# Patient Record
Sex: Male | Born: 1975 | Race: White | Hispanic: No | Marital: Single | Smoking: Current every day smoker
Health system: Southern US, Community
[De-identification: ages and names within clinical notes are randomized; demographics above are authoritative.]

## PROBLEM LIST (undated history)

## (undated) HISTORY — PX: OTHER SURGICAL HISTORY: SHX169

---

## 2007-03-24 ENCOUNTER — Emergency Department (HOSPITAL_COMMUNITY): Admission: EM | Admit: 2007-03-24 | Discharge: 2007-03-24 | Payer: Self-pay | Admitting: Emergency Medicine

## 2007-07-05 ENCOUNTER — Emergency Department (HOSPITAL_COMMUNITY): Admission: EM | Admit: 2007-07-05 | Discharge: 2007-07-05 | Payer: Self-pay | Admitting: Emergency Medicine

## 2009-06-09 ENCOUNTER — Emergency Department (HOSPITAL_COMMUNITY): Admission: EM | Admit: 2009-06-09 | Discharge: 2009-06-09 | Payer: Self-pay | Admitting: Emergency Medicine

## 2009-11-17 ENCOUNTER — Emergency Department (HOSPITAL_COMMUNITY): Admission: EM | Admit: 2009-11-17 | Discharge: 2009-11-17 | Payer: Self-pay | Admitting: Emergency Medicine

## 2010-08-31 LAB — CULTURE, ROUTINE-ABSCESS

## 2011-08-09 IMAGING — CR DG LUMBAR SPINE COMPLETE 4+V
5 series · 5 of 5 positions shown · non-contrast
Comparison: None.

CLINICAL DATA: Back pain.  Leg pain.  Evaluate for dislocation.

LUMBAR SPINE - COMPLETE 4+ VIEW

[t l-spine a.p.]
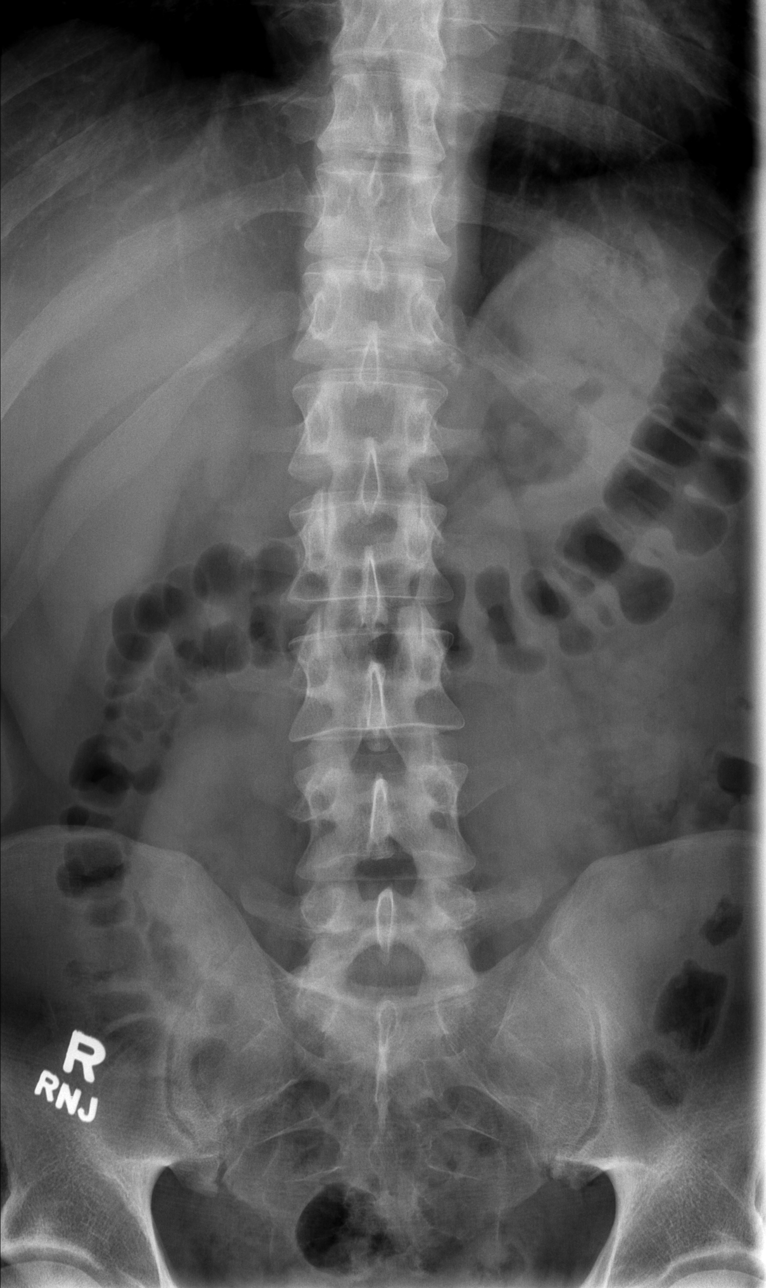

[t l-spine oblique exposure (1 of 2)]
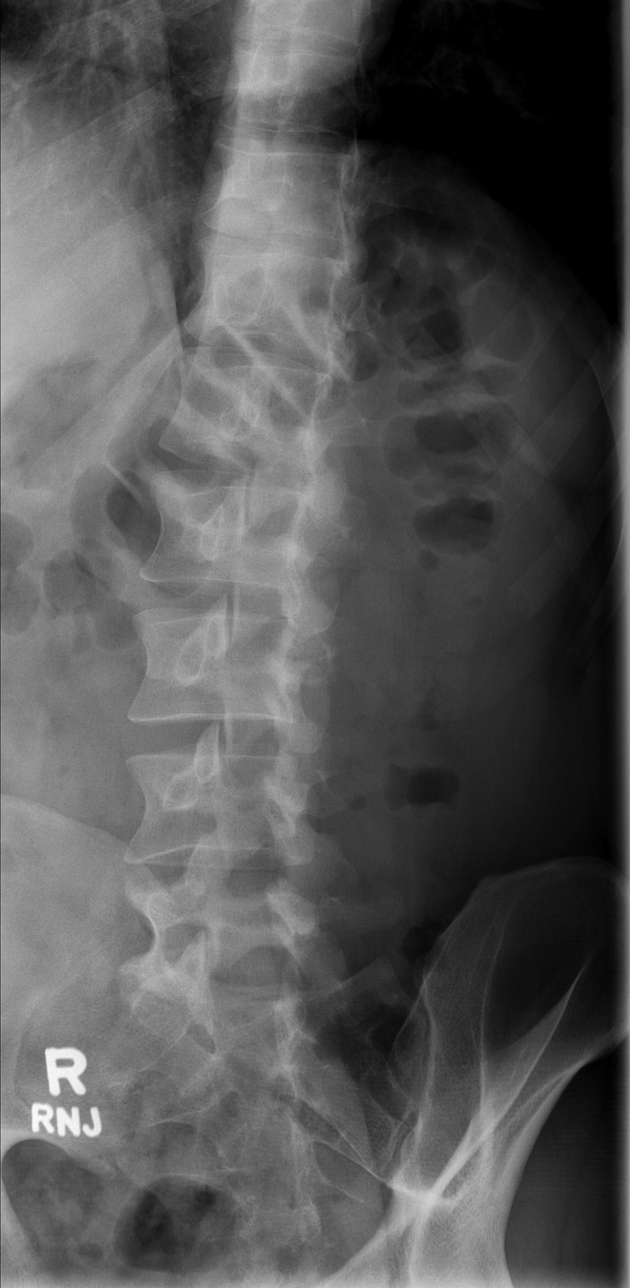

[t l-spine oblique exposure (2 of 2)]
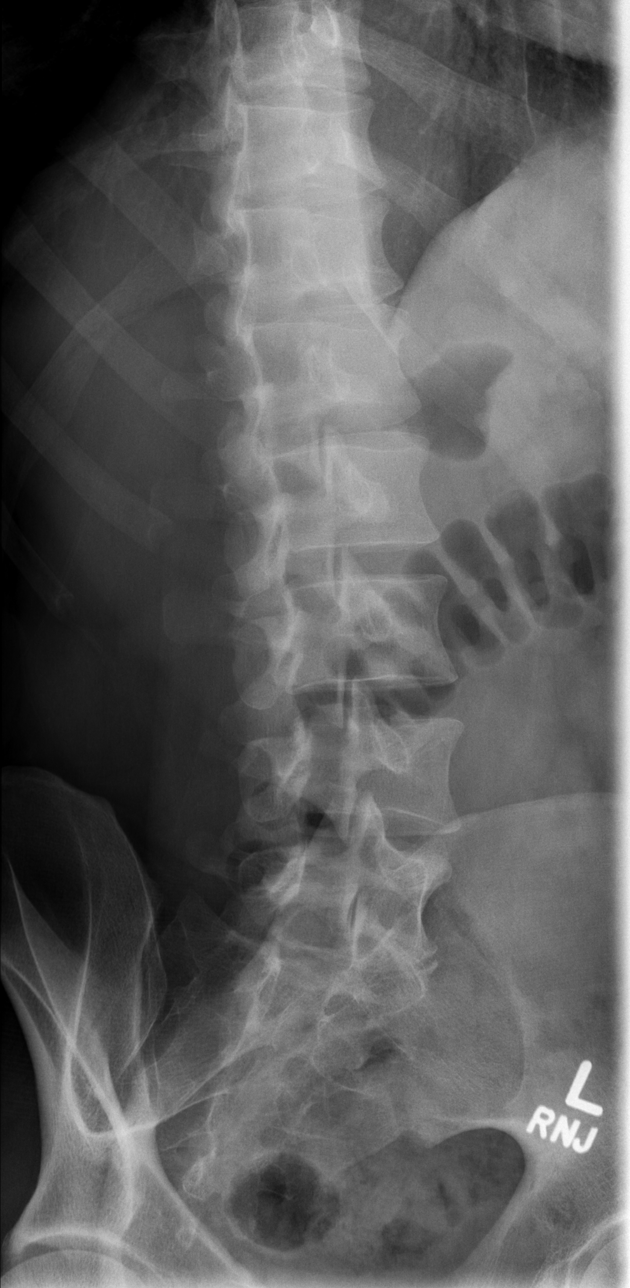

[t l-spine lat]
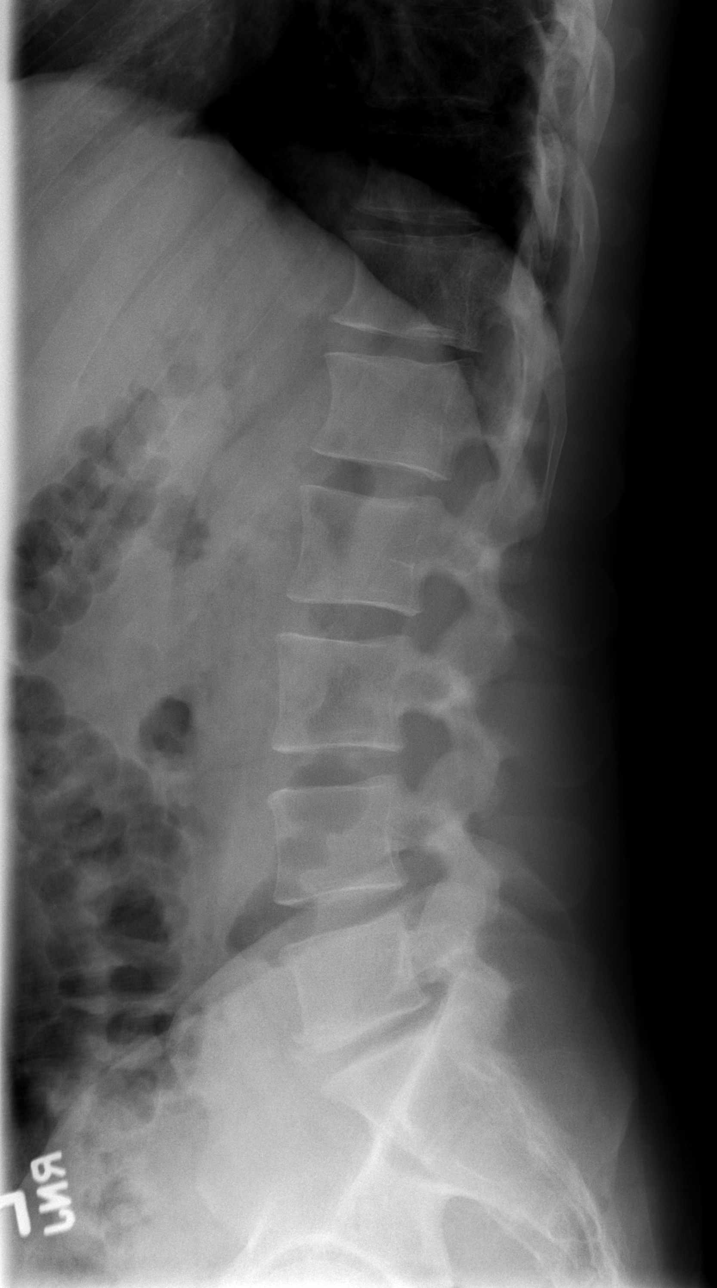

[t l-spine l5-s1 spot]
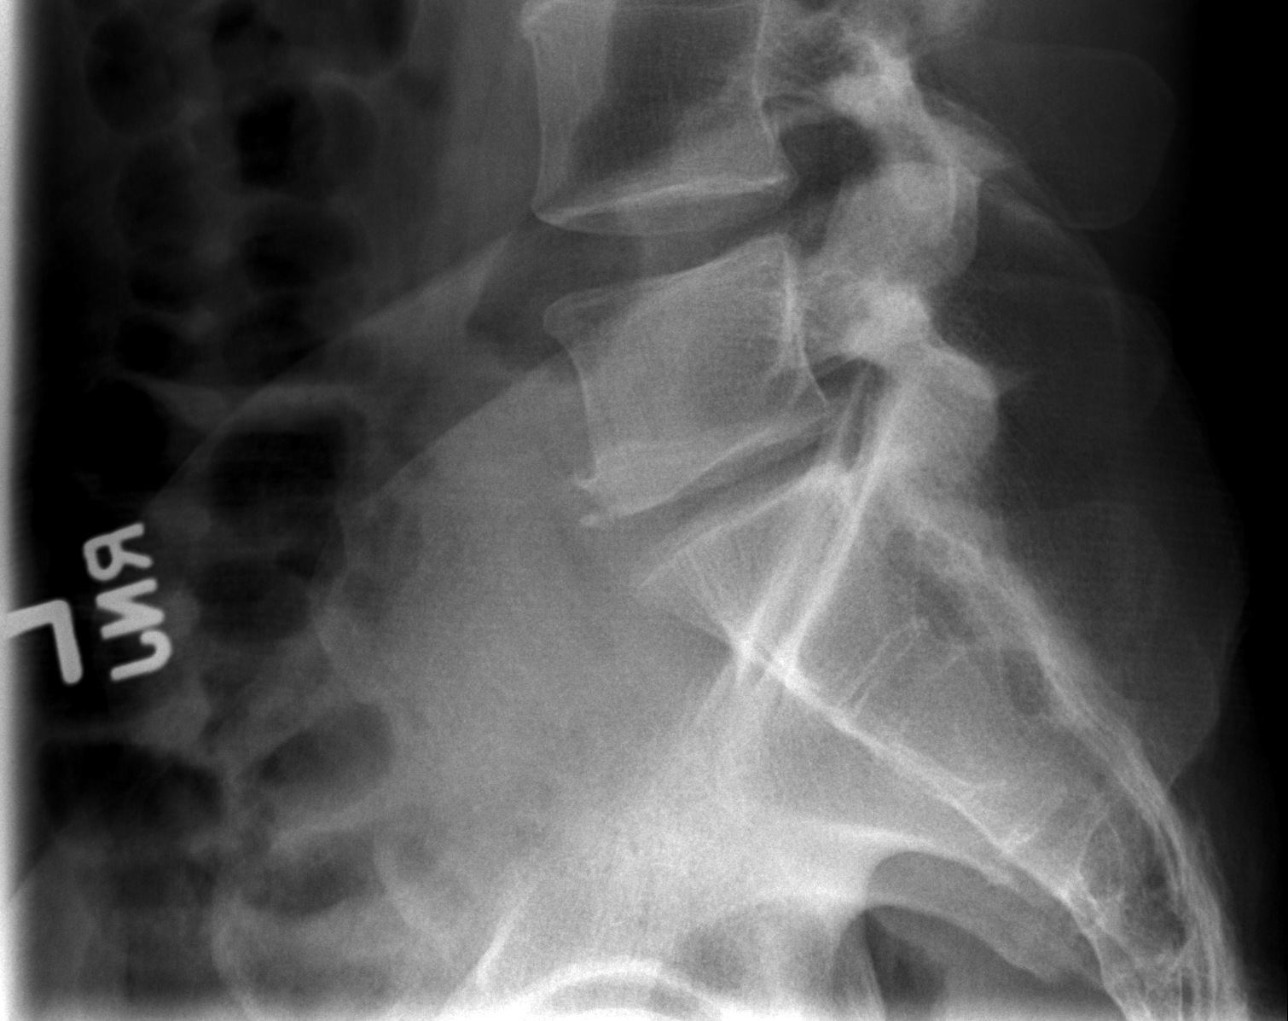

[5 of 5 positions shown; findings below may reference images not displayed]

FINDINGS: There is a mild dextroconvex scoliosis centered around L2-
L3.  Vertebral body height is preserved.  No pars interarticularis
defects are identified.  L5-S1 degenerative disc disease is present
with loss of disc height and endplate osteophytes. Please note that
under current guidelines, plain films have limited utility in the
assessment of low back pain unless "red flags" are present.
IMPRESSION: 1. L5-S1 spondylosis with loss of disc height.
2.  No acute osseous abnormality.

## 2014-01-13 ENCOUNTER — Encounter (HOSPITAL_COMMUNITY): Payer: Self-pay | Admitting: Emergency Medicine

## 2014-01-13 ENCOUNTER — Emergency Department (HOSPITAL_COMMUNITY)
Admission: EM | Admit: 2014-01-13 | Discharge: 2014-01-13 | Disposition: A | Discharge status: Home / Self Care | Payer: Self-pay | Attending: Emergency Medicine | Admitting: Emergency Medicine

## 2014-01-13 DIAGNOSIS — F17200 Nicotine dependence, unspecified, uncomplicated: Secondary | ICD-10-CM | POA: Insufficient documentation

## 2014-01-13 DIAGNOSIS — X19XXXA Contact with other heat and hot substances, initial encounter: Secondary | ICD-10-CM | POA: Insufficient documentation

## 2014-01-13 DIAGNOSIS — S0510XA Contusion of eyeball and orbital tissues, unspecified eye, initial encounter: Secondary | ICD-10-CM | POA: Insufficient documentation

## 2014-01-13 DIAGNOSIS — Y93G9 Activity, other involving cooking and grilling: Secondary | ICD-10-CM | POA: Insufficient documentation

## 2014-01-13 DIAGNOSIS — H109 Unspecified conjunctivitis: Secondary | ICD-10-CM | POA: Insufficient documentation

## 2014-01-13 DIAGNOSIS — Y9289 Other specified places as the place of occurrence of the external cause: Secondary | ICD-10-CM | POA: Insufficient documentation

## 2014-01-13 MED ORDER — TOBRAMYCIN 0.3 % OP OINT
TOPICAL_OINTMENT | Freq: Four times a day (QID) | OPHTHALMIC | Status: DC
  Filled 2014-01-13: qty 3.5

## 2014-01-13 MED ORDER — TOBRAMYCIN 0.3 % OP SOLN
2.0000 [drp] | Freq: Four times a day (QID) | OPHTHALMIC | Status: DC
  Administered 2014-01-13: 2 [drp] via OPHTHALMIC
  Filled 2014-01-13: qty 5

## 2014-01-13 MED ORDER — TETRACAINE HCL 0.5 % OP SOLN
2.0000 [drp] | Freq: Once | OPHTHALMIC | Status: DC
  Filled 2014-01-13: qty 2

## 2014-01-13 MED ORDER — HYDROCODONE-ACETAMINOPHEN 5-325 MG PO TABS: 1.0000 | ORAL_TABLET | ORAL | Status: DC | PRN

## 2014-01-13 MED ORDER — FLUORESCEIN SODIUM 1 MG OP STRP
1.0000 | ORAL_STRIP | Freq: Once | OPHTHALMIC | Status: DC
  Filled 2014-01-13: qty 1

## 2014-01-13 NOTE — ED Notes (Signed)
Pt c/o eye swelling, irritation, drainage, and redness since Tuesday.  Reported he was cooking a work on Tuesday and an onion hot onion popped up into eye.  Symptoms have progressively worsened.

## 2014-01-13 NOTE — ED Provider Notes (Signed)
CSN: [id number]     Arrival date & time 01/13/14  1353 History  This chart was scribed for non-physician practitioner, Elpidio AnisShari Teisha Trowbridge, PA-C working with Suzi RootsKevin E Steinl, MD by Greggory StallionKayla Andersen, ED scribe. This patient was seen in room WTR6/WTR6 and the patient's care was started at 3:09 PM.   Chief Complaint  Patient presents with  . Eye Injury   The history is provided by the patient. No language interpreter was used.   HPI Comments: Wayne GamblesStephen Blatchley is a 38 y.o. male who presents to the Emergency Department complaining of eye injury that occurred 5 days ago. States he was cooking and a hot onion popped up on his eyelid. He has worsening eye pain, swelling, irritation, redness, pus and watery drainage. Denies visual disturbances.   History reviewed. No pertinent past medical history. Past Surgical History  Procedure Laterality Date  . Skull surger     History reviewed. No pertinent family history. History  Substance Use Topics  . Smoking status: Current Every Day Smoker  . Smokeless tobacco: Not on file  . Alcohol Use: Yes    Review of Systems  HENT: Positive for facial swelling.   Eyes: Positive for pain, discharge and redness. Negative for visual disturbance.  All other systems reviewed and are negative.  Allergies  Review of patient's allergies indicates no known allergies.  Home Medications   Prior to Admission medications   Not on File   BP 139/84  Pulse 84  Temp(Src) 99 F (37.2 C) (Oral)  Resp 16  SpO2 100%  Physical Exam  Nursing note and vitals reviewed. Constitutional: He is oriented to person, place, and time. He appears well-developed and well-nourished. No distress.  HENT:  Head: Normocephalic and atraumatic.  Eyes: EOM are normal. Pupils are equal, round, and reactive to light.  Excessively swollen conjunctiva of left eye with purulent drainage and injection. Cornea appears clear. No hyphema. No obvious ulceration. Lids are unremarkable.   Neck: Neck supple.  No tracheal deviation present.  Cardiovascular: Normal rate.   Pulmonary/Chest: Effort normal. No respiratory distress.  Musculoskeletal: Normal range of motion.  Neurological: He is alert and oriented to person, place, and time.  Skin: Skin is warm and dry.  Psychiatric: He has a normal mood and affect. His behavior is normal.    ED Course  Procedures (including critical care time)  DIAGNOSTIC STUDIES: Oxygen Saturation is 100% on RA, normal by my interpretation.    COORDINATION OF CARE: 3:11 PM-Discussed treatment plan which includes checking for corneal ulcerations with pt at bedside and pt agreed to plan.   Labs Review Labs Reviewed - No data to display  Imaging Review No results found.   EKG Interpretation None      MDM   Final diagnoses:  None    1. Conjunctivitis  No visual impairment, no corneal ulceration. Suspect infection following cooking accident 5 days ago. Treated with Tobrex and ophtho follow up if symptoms are not improved within 48 hours.   I personally performed the services described in this documentation, which was scribed in my presence. The recorded information has been reviewed and is accurate.  Arnoldo HookerShari A Tierany Appleby, PA-C 01/13/14 1533

## 2014-01-13 NOTE — Discharge Instructions (Signed)

## 2014-01-14 NOTE — ED Provider Notes (Signed)
Medical screening examination/treatment/procedure(s) were performed by non-physician practitioner and as supervising physician I was immediately available for consultation/collaboration.     Erisa Mehlman E Jazmin Ley, MD 01/14/14 0910 

## 2017-05-09 DIAGNOSIS — L309 Dermatitis, unspecified: Secondary | ICD-10-CM | POA: Diagnosis not present

## 2018-12-28 DIAGNOSIS — Z20828 Contact with and (suspected) exposure to other viral communicable diseases: Secondary | ICD-10-CM | POA: Diagnosis not present

## 2019-06-19 ENCOUNTER — Ambulatory Visit: Payer: Self-pay | Attending: Internal Medicine

## 2019-06-19 DIAGNOSIS — Z20822 Contact with and (suspected) exposure to covid-19: Secondary | ICD-10-CM

## 2019-06-19 DIAGNOSIS — U071 COVID-19: Secondary | ICD-10-CM | POA: Insufficient documentation

## 2019-06-21 LAB — NOVEL CORONAVIRUS, NAA: SARS-CoV-2, NAA: DETECTED — AB

## 2022-02-28 ENCOUNTER — Emergency Department (HOSPITAL_COMMUNITY)
Admission: EM | Admit: 2022-02-28 | Discharge: 2022-02-28 | Disposition: A | Discharge status: Home / Self Care | Payer: BC Managed Care – PPO | Attending: Emergency Medicine | Admitting: Emergency Medicine

## 2022-02-28 ENCOUNTER — Encounter (HOSPITAL_COMMUNITY): Payer: Self-pay

## 2022-02-28 ENCOUNTER — Other Ambulatory Visit: Payer: Self-pay

## 2022-02-28 DIAGNOSIS — L02413 Cutaneous abscess of right upper limb: Secondary | ICD-10-CM | POA: Diagnosis not present

## 2022-02-28 DIAGNOSIS — L03113 Cellulitis of right upper limb: Secondary | ICD-10-CM

## 2022-02-28 DIAGNOSIS — L0291 Cutaneous abscess, unspecified: Secondary | ICD-10-CM

## 2022-02-28 MED ORDER — DOXYCYCLINE HYCLATE 100 MG PO CAPS: 100.0000 mg | ORAL_CAPSULE | Freq: Two times a day (BID) | ORAL | 0 refills | Status: DC

## 2022-02-28 MED ORDER — DOXYCYCLINE HYCLATE 100 MG PO TABS
100.0000 mg | ORAL_TABLET | Freq: Once | ORAL | Status: AC
  Administered 2022-02-28: 100 mg via ORAL
  Filled 2022-02-28: qty 1

## 2022-02-28 NOTE — ED Provider Notes (Signed)
[facility] DEPT Provider Note   CSN: [id number] Arrival date & time: 02/28/22  0998     History  Chief Complaint  Patient presents with   Insect Bite    Wayne Craig is a 46 y.o. male.  Patient presents to the hospital complaining of a possible insect bite to the right anterior forearm.  Patient states that he noticed after being outside on his porch for a long time Friday.  He states had a similar insect bite or lesion with drainage 2 weeks ago on his other forearm.  He states that the current lesion has been draining quite, purulent material, and was painful to sleep on last night.  He denies fevers, nausea, vomiting, abdominal pain, shortness of breath.  The patient has no other relevant past medical history  HPI     Home Medications Prior to Admission medications   Medication Sig Start Date End Date Taking? Authorizing Provider  doxycycline (VIBRAMYCIN) 100 MG capsule Take 1 capsule (100 mg total) by mouth 2 (two) times daily. 02/28/22  Yes Dorothyann Peng, PA-C  HYDROcodone-acetaminophen (NORCO/VICODIN) 5-325 MG per tablet Take 1-2 tablets by mouth every 4 (four) hours as needed. 01/13/14   Charlann Lange, PA-C      Allergies    Patient has no known allergies.    Review of Systems   Review of Systems  Skin:        Lesion to anterior right forearm    Physical Exam Updated Vital Signs BP (!) 175/98 (BP Location: Left Arm)   Pulse (!) 101   Temp 98.2 F (36.8 C) (Oral)   Resp 17   Ht 6' (1.829 m)   Wt 86.2 kg   SpO2 99%   BMI 25.77 kg/m  Physical Exam Vitals and nursing note reviewed.  Constitutional:      General: He is not in acute distress.    Appearance: He is well-developed.  HENT:     Head: Normocephalic and atraumatic.  Eyes:     Conjunctiva/sclera: Conjunctivae normal.  Cardiovascular:     Rate and Rhythm: Normal rate.  Pulmonary:     Effort: Pulmonary effort is normal.  Abdominal:     General: Abdomen is flat.   Musculoskeletal:        General: No swelling.     Cervical back: Normal range of motion.  Skin:    General: Skin is warm and dry.     Capillary Refill: Capillary refill takes less than 2 seconds.     Findings: Lesion present.     Comments: See image below.  Mild swelling noted and outlined.  Purulent discharge noted  Neurological:     Mental Status: He is alert.  Psychiatric:        Mood and Affect: Mood normal.      ED Results / Procedures / Treatments   Labs (all labs ordered are listed, but only abnormal results are displayed) Labs Reviewed - No data to display  EKG None  Radiology No results found.  Procedures Procedures    Medications Ordered in ED Medications  doxycycline (VIBRA-TABS) tablet 100 mg (has no administration in time range)    ED Course/ Medical Decision Making/ A&P                           Medical Decision Making  Patient presents to hospital with a chief complaint of skin lesion on the right forearm.  Differential includes but  is not limited to cellulitis, abscess, insect bite, and others  No significant fluctuance was noted on exam.  The wound is actively draining at this time.  I see no indication for incision and drainage.  There is no indication at this time for imaging.  The patient has normal vitals and I see no indication for lab work at this time.  The patient has a skin lesion consistent with possible abscess with cellulitis.  The wound is actively draining.  I do not feel fluctuant area amenable to incision and drainage at this time.  Plan to discharge patient home with course of doxycycline for skin, soft tissue coverage.  Patient given return precautions including systemic symptoms and spread of erythema outside of its current borders.  Patient voices understanding.        Final Clinical Impression(s) / ED Diagnoses Final diagnoses:  Abscess  Cellulitis of right upper extremity    Rx / DC Orders ED Discharge Orders           Ordered    doxycycline (VIBRAMYCIN) 100 MG capsule  2 times daily        02/28/22 0754              Darrick Grinder, PA-C 02/28/22 0759    Terrilee Files, MD 02/28/22 1815

## 2022-02-28 NOTE — ED Triage Notes (Addendum)
Ambulatory to the ED with c/o insect bite to R forearm. States he got bit by something on Friday afternoon. Large area of redness and swelling with approx. 1/2 inch area of open wound noted with active drainage. Denies fevers or chills.

## 2022-02-28 NOTE — Discharge Instructions (Addendum)
You were diagnosed today with cellulitis of the right forearm.  Please take the prescribed antibiotics.  If you develop a fever or if the area of redness begins to spread, please return for further evaluation.

## 2022-06-30 ENCOUNTER — Emergency Department (HOSPITAL_COMMUNITY)
Admission: EM | Admit: 2022-06-30 | Discharge: 2022-06-30 | Disposition: A | Discharge status: Home / Self Care | Payer: BC Managed Care – PPO | Attending: Emergency Medicine | Admitting: Emergency Medicine

## 2022-06-30 ENCOUNTER — Other Ambulatory Visit: Payer: Self-pay

## 2022-06-30 ENCOUNTER — Emergency Department (HOSPITAL_COMMUNITY): Payer: BC Managed Care – PPO

## 2022-06-30 DIAGNOSIS — R000 Tachycardia, unspecified: Secondary | ICD-10-CM | POA: Diagnosis not present

## 2022-06-30 DIAGNOSIS — R0602 Shortness of breath: Secondary | ICD-10-CM | POA: Diagnosis present

## 2022-06-30 DIAGNOSIS — J189 Pneumonia, unspecified organism: Secondary | ICD-10-CM

## 2022-06-30 DIAGNOSIS — J181 Lobar pneumonia, unspecified organism: Secondary | ICD-10-CM | POA: Diagnosis not present

## 2022-06-30 LAB — CBC WITH DIFFERENTIAL/PLATELET
Abs Immature Granulocytes: 1.56 10*3/uL — ABNORMAL HIGH (ref 0.00–0.07)
Basophils Absolute: 0.1 10*3/uL (ref 0.0–0.1)
Basophils Relative: 0 %
Eosinophils Absolute: 0 10*3/uL (ref 0.0–0.5)
Eosinophils Relative: 0 %
HCT: 44.4 % (ref 39.0–52.0)
Hemoglobin: 15.4 g/dL (ref 13.0–17.0)
Immature Granulocytes: 3 %
Lymphocytes Relative: 1 %
Lymphs Abs: 0.4 10*3/uL — ABNORMAL LOW (ref 0.7–4.0)
MCH: 34.5 pg — ABNORMAL HIGH (ref 26.0–34.0)
MCHC: 34.7 g/dL (ref 30.0–36.0)
MCV: 99.6 fL (ref 80.0–100.0)
Monocytes Absolute: 1.6 10*3/uL — ABNORMAL HIGH (ref 0.1–1.0)
Monocytes Relative: 3 %
Neutro Abs: 45.1 10*3/uL — ABNORMAL HIGH (ref 1.7–7.7)
Neutrophils Relative %: 93 %
Platelets: 331 10*3/uL (ref 150–400)
RBC: 4.46 MIL/uL (ref 4.22–5.81)
RDW: 13.7 % (ref 11.5–15.5)
WBC Morphology: INCREASED
WBC: 48.8 10*3/uL — ABNORMAL HIGH (ref 4.0–10.5)
nRBC: 0 % (ref 0.0–0.2)

## 2022-06-30 LAB — BASIC METABOLIC PANEL
Anion gap: 14 (ref 5–15)
BUN: 20 mg/dL (ref 6–20)
CO2: 24 mmol/L (ref 22–32)
Calcium: 8.2 mg/dL — ABNORMAL LOW (ref 8.9–10.3)
Chloride: 93 mmol/L — ABNORMAL LOW (ref 98–111)
Creatinine, Ser: 1.89 mg/dL — ABNORMAL HIGH (ref 0.61–1.24)
GFR, Estimated: 43 mL/min — ABNORMAL LOW (ref >60)
Glucose, Bld: 219 mg/dL — ABNORMAL HIGH (ref 70–99)
Potassium: 3.5 mmol/L (ref 3.5–5.1)
Sodium: 131 mmol/L — ABNORMAL LOW (ref 135–145)

## 2022-06-30 LAB — TROPONIN I (HIGH SENSITIVITY): Troponin I (High Sensitivity): 8 ng/L (ref <18)

## 2022-06-30 MED ORDER — AMOXICILLIN-POT CLAVULANATE 875-125 MG PO TABS: 1.0000 | ORAL_TABLET | Freq: Two times a day (BID) | ORAL | 0 refills | Status: DC

## 2022-06-30 MED ORDER — DOXYCYCLINE HYCLATE 100 MG PO CAPS: 100.0000 mg | ORAL_CAPSULE | Freq: Two times a day (BID) | ORAL | 0 refills | Status: DC

## 2022-06-30 MED ORDER — ALBUTEROL SULFATE HFA 108 (90 BASE) MCG/ACT IN AERS: 2.0000 | INHALATION_SPRAY | RESPIRATORY_TRACT | Status: DC | PRN

## 2022-06-30 NOTE — Discharge Instructions (Signed)
Looks like you have pneumonia on your x-ray.  Your blood work was very abnormal with a very high white blood cell count.  This could be due to your infection.  You need to have this rechecked once your infection has resolved.  Please return to the emergency department at any point your symptoms worsen to be reevaluated.

## 2022-06-30 NOTE — ED Triage Notes (Signed)
Pt states "it's hard to breath. I've been sick for 10 days coughing, fever"  Dx with RSV last Saturday at [facility].

## 2022-06-30 NOTE — ED Provider Notes (Signed)
[facility] DEPT Provider Note   CSN: [id number] Arrival date & time: 06/30/22  6283     History  Chief Complaint  Patient presents with   Shortness of Breath    Wayne Craig is a 47 y.o. male.  47 yo M with a chief complaints of cough congestion hoarse voice.  Has been going on for about 10 days now.  The patient has been seen in urgent care and was told he had RSV and was started on supportive therapy.  Since then the patient has had slowly worsening difficulty breathing.  Feels like his chest is a bit tight.  Had fevers at the onset but not really since.  Had gotten a little bit better and things got worse a few days ago.  He denies any prior medical problems.   Shortness of Breath      Home Medications Prior to Admission medications   Medication Sig Start Date End Date Taking? Authorizing Provider  amoxicillin-clavulanate (AUGMENTIN) 875-125 MG tablet Take 1 tablet by mouth every 12 (twelve) hours. 06/30/22  Yes Deno Etienne, DO  doxycycline (VIBRAMYCIN) 100 MG capsule Take 1 capsule (100 mg total) by mouth 2 (two) times daily. One po bid x 7 days 06/30/22  Yes Deno Etienne, DO  HYDROcodone-acetaminophen (NORCO/VICODIN) 5-325 MG per tablet Take 1-2 tablets by mouth every 4 (four) hours as needed. 01/13/14   Charlann Lange, PA-C      Allergies    Patient has no known allergies.    Review of Systems   Review of Systems  Respiratory:  Positive for shortness of breath.     Physical Exam Updated Vital Signs BP 107/70 (BP Location: Left Arm)   Pulse (!) 117   Temp (!) 97.5 F (36.4 C) (Oral)   Resp 15   Ht 6' (1.829 m)   Wt 86.2 kg   SpO2 100%   BMI 25.77 kg/m  Physical Exam Vitals and nursing note reviewed.  Constitutional:      Appearance: He is well-developed.  HENT:     Head: Normocephalic and atraumatic.  Eyes:     Pupils: Pupils are equal, round, and reactive to light.  Neck:     Vascular: No JVD.  Cardiovascular:     Rate  and Rhythm: Normal rate and regular rhythm.     Heart sounds: No murmur heard.    No friction rub. No gallop.  Pulmonary:     Effort: No respiratory distress.     Breath sounds: No wheezing.     Comments: Slightly diminished breath sounds in all fields otherwise good aeration.  Clear lung sounds. Abdominal:     General: There is no distension.     Tenderness: There is no abdominal tenderness. There is no guarding or rebound.  Musculoskeletal:        General: Normal range of motion.     Cervical back: Normal range of motion and neck supple.  Skin:    Coloration: Skin is not pale.     Findings: No rash.  Neurological:     Mental Status: He is alert and oriented to person, place, and time.  Psychiatric:        Behavior: Behavior normal.     ED Results / Procedures / Treatments   Labs (all labs ordered are listed, but only abnormal results are displayed) Labs Reviewed  BASIC METABOLIC PANEL - Abnormal; Notable for the following components:      Result Value   Sodium 131 (*)  Chloride 93 (*)    Glucose, Bld 219 (*)    Creatinine, Ser 1.89 (*)    Calcium 8.2 (*)    GFR, Estimated 43 (*)    All other components within normal limits  CBC WITH DIFFERENTIAL/PLATELET - Abnormal; Notable for the following components:   WBC 48.8 (*)    MCH 34.5 (*)    Neutro Abs 45.1 (*)    Lymphs Abs 0.4 (*)    Monocytes Absolute 1.6 (*)    Abs Immature Granulocytes 1.56 (*)    All other components within normal limits  TROPONIN I (HIGH SENSITIVITY)    EKG EKG Interpretation  Date/Time:  Wednesday June 30 2022 08:03:35 EST Ventricular Rate:  119 PR Interval:  128 QRS Duration: 98 QT Interval:  348 QTC Calculation: 490 R Axis:   44 Text Interpretation: Sinus tachycardia Borderline T abnormalities, inferior leads Borderline prolonged QT interval No old tracing to compare Confirmed by Dorie Rank [phone]) on 06/30/2022 8:43:03 AM  Radiology DG Chest 2 View  Result Date:  06/30/2022 CLINICAL DATA:  Shortness of breath. 10 days of feeling sick, coughing, and with fever. Diagnosed with RSV 4 days ago. EXAM: CHEST - 2 VIEW COMPARISON:  None Available. FINDINGS: Cardiac silhouette appears normal in size. Mediastinal contours are within normal limits. Mildly decreased lung volumes. Moderate left basilar opacification, likely combination of a airspace opacification and mild-to-moderate pleural fluid. Mild right basilar horizontal linear interstitial thickening/subsegmental atelectasis. No pneumothorax. Minimal multilevel degenerative disc changes of the thoracic spine. IMPRESSION: 1. Moderate left basilar opacification, likely a combination of airspace opacification (atelectasis versus pneumonia) and mild-to-moderate left pleural fluid. 2. Mild right basilar subsegmental atelectasis. Electronically Signed   By: Yvonne Kendall M.D.   On: 06/30/2022 08:22    Procedures Procedures    Medications Ordered in ED Medications  albuterol (VENTOLIN HFA) 108 (90 Base) MCG/ACT inhaler 2 puff (has no administration in time range)    ED Course/ Medical Decision Making/ A&P                             Medical Decision Making Risk Prescription drug management.   46 yo M with a chief complaints of cough shortness of breath.  Patient has been sick for about 11 days.  Initially was diagnosed with RSV had some improvement and then worsening.  History most consistent with a acute bacterial on viral syndrome.  He actually looks fairly well.  Has no hypoxia here.  Good lung sounds for me and good aeration.  He had labs obtained on arrival, concerning for a significant leukocytosis as well as perhaps a mild bump in his renal function.  Chest x-ray is concerning for a left lower lobe pneumonia on my independent interpretation.  I discussed the results with him.  Discussed inpatient versus outpatient therapy.  Would like to try a trial of outpatient therapy.  Will start on dual antibiotic therapy  as an outpatient.  Encouraged him to follow-up with his PCP hopefully within a week for recheck of his blood work.  He understands return for any worsening.  9:50 AM:  I have discussed the diagnosis/risks/treatment options with the patient.  Evaluation and diagnostic testing in the emergency department does not suggest an emergent condition requiring admission or immediate intervention beyond what has been performed at this time.  They will follow up with PCP. We also discussed returning to the ED immediately if new or worsening sx occur. We  discussed the sx which are most concerning (e.g., sudden worsening pain, fever, inability to tolerate by mouth) that necessitate immediate return. Medications administered to the patient during their visit and any new prescriptions provided to the patient are listed below.  Medications given during this visit Medications  albuterol (VENTOLIN HFA) 108 (90 Base) MCG/ACT inhaler 2 puff (has no administration in time range)     The patient appears reasonably screen and/or stabilized for discharge and I doubt any other medical condition or other [facility] requiring further screening, evaluation, or treatment in the ED at this time prior to discharge.          Final Clinical Impression(s) / ED Diagnoses Final diagnoses:  Community acquired pneumonia of left lower lobe of lung    Rx / DC Orders ED Discharge Orders          Ordered    doxycycline (VIBRAMYCIN) 100 MG capsule  2 times daily        06/30/22 0944    amoxicillin-clavulanate (AUGMENTIN) 875-125 MG tablet  Every 12 hours        06/30/22 0944              Melene Plan, DO 06/30/22 [phone]

## 2022-06-30 NOTE — ED Provider Triage Note (Signed)
Emergency Medicine Provider Triage Evaluation Note  Kele Barthelemy , a 47 y.o. male  was evaluated in triage.  Pt complains of shortness of breath.  States he has been sick for 10 days.  Was seen at urgent care on Saturday and tested negative for flu and COVID.  Was told that he likely has RSV.  Was given prescriptions for cough medicine and albuterol.  States that the cough has mostly resolved, but has pain in bilateral lower ribs with radiation up to his chest.  Worse with lying down.  Better with sitting forward.  No cardiac history.  Shortness of breath began yesterday just prior to going to bed.  Denies fevers or chills.  Review of Systems  Positive: As above Negative: As above  Physical Exam  BP 107/70 (BP Location: Left Arm)   Pulse (!) 117   Temp (!) 97.5 F (36.4 C) (Oral)   Resp 15   Ht 6' (1.829 m)   Wt 86.2 kg   SpO2 100%   BMI 25.77 kg/m  Gen:   Awake, no distress   Resp:  Normal effort no adventitious breath sounds MSK:   Moves extremities without difficulty  Other:    Medical Decision Making  Medically screening exam initiated at 8:28 AM.  Appropriate orders placed.  Joshwa Hemric was informed that the remainder of the evaluation will be completed by another provider, this initial triage assessment does not replace that evaluation, and the importance of remaining in the ED until their evaluation is complete.     Roylene Reason, [place] 06/30/22 [phone]

## 2022-07-10 ENCOUNTER — Emergency Department (HOSPITAL_COMMUNITY): Payer: BC Managed Care – PPO

## 2022-07-10 ENCOUNTER — Inpatient Hospital Stay (HOSPITAL_COMMUNITY)
Admission: EM | Admit: 2022-07-10 | Discharge: 2022-07-19 | DRG: 871 | Disposition: A | Discharge status: Home / Self Care | Payer: BC Managed Care – PPO | Attending: Internal Medicine | Admitting: Internal Medicine

## 2022-07-10 ENCOUNTER — Encounter (HOSPITAL_COMMUNITY): Payer: Self-pay

## 2022-07-10 ENCOUNTER — Other Ambulatory Visit: Payer: Self-pay

## 2022-07-10 ENCOUNTER — Inpatient Hospital Stay (HOSPITAL_COMMUNITY): Payer: BC Managed Care – PPO

## 2022-07-10 DIAGNOSIS — J90 Pleural effusion, not elsewhere classified: Secondary | ICD-10-CM | POA: Diagnosis not present

## 2022-07-10 DIAGNOSIS — E440 Moderate protein-calorie malnutrition: Secondary | ICD-10-CM | POA: Diagnosis present

## 2022-07-10 DIAGNOSIS — Z4682 Encounter for fitting and adjustment of non-vascular catheter: Secondary | ICD-10-CM

## 2022-07-10 DIAGNOSIS — J918 Pleural effusion in other conditions classified elsewhere: Secondary | ICD-10-CM | POA: Diagnosis present

## 2022-07-10 DIAGNOSIS — E1165 Type 2 diabetes mellitus with hyperglycemia: Secondary | ICD-10-CM | POA: Diagnosis present

## 2022-07-10 DIAGNOSIS — J189 Pneumonia, unspecified organism: Secondary | ICD-10-CM | POA: Diagnosis present

## 2022-07-10 DIAGNOSIS — Z6824 Body mass index (BMI) 24.0-24.9, adult: Secondary | ICD-10-CM | POA: Diagnosis not present

## 2022-07-10 DIAGNOSIS — F17210 Nicotine dependence, cigarettes, uncomplicated: Secondary | ICD-10-CM | POA: Diagnosis present

## 2022-07-10 DIAGNOSIS — J948 Other specified pleural conditions: Secondary | ICD-10-CM

## 2022-07-10 DIAGNOSIS — J9811 Atelectasis: Secondary | ICD-10-CM | POA: Diagnosis present

## 2022-07-10 DIAGNOSIS — Z23 Encounter for immunization: Secondary | ICD-10-CM

## 2022-07-10 DIAGNOSIS — D7589 Other specified diseases of blood and blood-forming organs: Secondary | ICD-10-CM | POA: Diagnosis present

## 2022-07-10 DIAGNOSIS — E876 Hypokalemia: Secondary | ICD-10-CM | POA: Diagnosis not present

## 2022-07-10 DIAGNOSIS — D75839 Thrombocytosis, unspecified: Secondary | ICD-10-CM | POA: Diagnosis not present

## 2022-07-10 DIAGNOSIS — G062 Extradural and subdural abscess, unspecified: Secondary | ICD-10-CM | POA: Diagnosis not present

## 2022-07-10 DIAGNOSIS — D65 Disseminated intravascular coagulation [defibrination syndrome]: Secondary | ICD-10-CM | POA: Diagnosis present

## 2022-07-10 DIAGNOSIS — J690 Pneumonitis due to inhalation of food and vomit: Secondary | ICD-10-CM

## 2022-07-10 DIAGNOSIS — A419 Sepsis, unspecified organism: Secondary | ICD-10-CM | POA: Diagnosis present

## 2022-07-10 DIAGNOSIS — Z1152 Encounter for screening for COVID-19: Secondary | ICD-10-CM

## 2022-07-10 DIAGNOSIS — J851 Abscess of lung with pneumonia: Secondary | ICD-10-CM | POA: Diagnosis present

## 2022-07-10 DIAGNOSIS — J869 Pyothorax without fistula: Secondary | ICD-10-CM | POA: Diagnosis not present

## 2022-07-10 DIAGNOSIS — Y95 Nosocomial condition: Secondary | ICD-10-CM | POA: Diagnosis present

## 2022-07-10 DIAGNOSIS — Z87828 Personal history of other (healed) physical injury and trauma: Secondary | ICD-10-CM

## 2022-07-10 DIAGNOSIS — J9819 Other pulmonary collapse: Secondary | ICD-10-CM | POA: Diagnosis not present

## 2022-07-10 DIAGNOSIS — J984 Other disorders of lung: Secondary | ICD-10-CM | POA: Diagnosis not present

## 2022-07-10 LAB — RESP PANEL BY RT-PCR (RSV, FLU A&B, COVID)  RVPGX2
Influenza A by PCR: NEGATIVE
Influenza B by PCR: NEGATIVE
Resp Syncytial Virus by PCR: NEGATIVE
SARS Coronavirus 2 by RT PCR: NEGATIVE

## 2022-07-10 LAB — URINALYSIS, ROUTINE W REFLEX MICROSCOPIC
Bilirubin Urine: NEGATIVE
Glucose, UA: NEGATIVE mg/dL
Hgb urine dipstick: NEGATIVE
Ketones, ur: NEGATIVE mg/dL
Leukocytes,Ua: NEGATIVE
Nitrite: NEGATIVE
Protein, ur: 100 mg/dL — AB
Specific Gravity, Urine: 1.028 (ref 1.005–1.030)
pH: 5 (ref 5.0–8.0)

## 2022-07-10 LAB — COMPREHENSIVE METABOLIC PANEL
ALT: 18 U/L (ref 0–44)
AST: 16 U/L (ref 15–41)
Albumin: 2.9 g/dL — ABNORMAL LOW (ref 3.5–5.0)
Alkaline Phosphatase: 87 U/L (ref 38–126)
Anion gap: 12 (ref 5–15)
BUN: 13 mg/dL (ref 6–20)
CO2: 23 mmol/L (ref 22–32)
Calcium: 8.6 mg/dL — ABNORMAL LOW (ref 8.9–10.3)
Chloride: 98 mmol/L (ref 98–111)
Creatinine, Ser: 0.83 mg/dL (ref 0.61–1.24)
GFR, Estimated: >60 mL/min (ref >60)
Glucose, Bld: 169 mg/dL — ABNORMAL HIGH (ref 70–99)
Potassium: 3.5 mmol/L (ref 3.5–5.1)
Sodium: 133 mmol/L — ABNORMAL LOW (ref 135–145)
Total Bilirubin: 1.4 mg/dL — ABNORMAL HIGH (ref 0.3–1.2)
Total Protein: 6.8 g/dL (ref 6.5–8.1)

## 2022-07-10 LAB — CBC WITH DIFFERENTIAL/PLATELET
Abs Immature Granulocytes: 0.34 10*3/uL — ABNORMAL HIGH (ref 0.00–0.07)
Basophils Absolute: 0.1 10*3/uL (ref 0.0–0.1)
Basophils Relative: 1 %
Eosinophils Absolute: 0.1 10*3/uL (ref 0.0–0.5)
Eosinophils Relative: 0 %
HCT: 44.4 % (ref 39.0–52.0)
Hemoglobin: 15.1 g/dL (ref 13.0–17.0)
Immature Granulocytes: 2 %
Lymphocytes Relative: 12 %
Lymphs Abs: 1.9 10*3/uL (ref 0.7–4.0)
MCH: 34.1 pg — ABNORMAL HIGH (ref 26.0–34.0)
MCHC: 34 g/dL (ref 30.0–36.0)
MCV: 100.2 fL — ABNORMAL HIGH (ref 80.0–100.0)
Monocytes Absolute: 1 10*3/uL (ref 0.1–1.0)
Monocytes Relative: 6 %
Neutro Abs: 12.8 10*3/uL — ABNORMAL HIGH (ref 1.7–7.7)
Neutrophils Relative %: 79 %
Platelets: 593 10*3/uL — ABNORMAL HIGH (ref 150–400)
RBC: 4.43 MIL/uL (ref 4.22–5.81)
RDW: 13.9 % (ref 11.5–15.5)
WBC: 16.2 10*3/uL — ABNORMAL HIGH (ref 4.0–10.5)
nRBC: 0 % (ref 0.0–0.2)

## 2022-07-10 LAB — BODY FLUID CELL COUNT WITH DIFFERENTIAL
Lymphs, Fluid: 3 %
Neutrophil Count, Fluid: 97 % — ABNORMAL HIGH (ref 0–25)
Total Nucleated Cell Count, Fluid: UNDETERMINED cu mm (ref 0–1000)

## 2022-07-10 LAB — LACTATE DEHYDROGENASE, PLEURAL OR PERITONEAL FLUID: LD, Fluid: 4713 U/L — ABNORMAL HIGH (ref 3–23)

## 2022-07-10 LAB — PROTEIN, PLEURAL OR PERITONEAL FLUID: Total protein, fluid: 5 g/dL

## 2022-07-10 LAB — LACTIC ACID, PLASMA
Lactic Acid, Venous: 2 mmol/L (ref 0.5–1.9)
Lactic Acid, Venous: 1.2 mmol/L (ref 0.5–1.9)

## 2022-07-10 LAB — HEMOGLOBIN A1C
Hgb A1c MFr Bld: 7.2 % — ABNORMAL HIGH (ref 4.8–5.6)
Mean Plasma Glucose: 159.94 mg/dL

## 2022-07-10 LAB — LIPASE, BLOOD: Lipase: 63 U/L — ABNORMAL HIGH (ref 11–51)

## 2022-07-10 LAB — STREP PNEUMONIAE URINARY ANTIGEN: Strep Pneumo Urinary Antigen: NEGATIVE

## 2022-07-10 LAB — EXPECTORATED SPUTUM ASSESSMENT W GRAM STAIN, RFLX TO RESP C

## 2022-07-10 LAB — MAGNESIUM: Magnesium: 1.6 mg/dL — ABNORMAL LOW (ref 1.7–2.4)

## 2022-07-10 LAB — MRSA NEXT GEN BY PCR, NASAL: MRSA by PCR Next Gen: NOT DETECTED

## 2022-07-10 LAB — BRAIN NATRIURETIC PEPTIDE: B Natriuretic Peptide: 63 pg/mL (ref 0.0–100.0)

## 2022-07-10 LAB — GLUCOSE, PLEURAL OR PERITONEAL FLUID: Glucose, Fluid: 81 mg/dL

## 2022-07-10 LAB — PHOSPHORUS: Phosphorus: 3.5 mg/dL (ref 2.5–4.6)

## 2022-07-10 MED ORDER — ONDANSETRON HCL 4 MG/2ML IJ SOLN: 4.0000 mg | Freq: Four times a day (QID) | INTRAMUSCULAR | Status: DC | PRN

## 2022-07-10 MED ORDER — PIPERACILLIN-TAZOBACTAM 3.375 G IVPB
3.3750 g | Freq: Three times a day (TID) | INTRAVENOUS | Status: AC
  Administered 2022-07-10 – 2022-07-17 (×22): 3.375 g via INTRAVENOUS
  Filled 2022-07-10 (×22): qty 50

## 2022-07-10 MED ORDER — ENOXAPARIN SODIUM 40 MG/0.4ML IJ SOSY
40.0000 mg | PREFILLED_SYRINGE | INTRAMUSCULAR | Status: DC
  Administered 2022-07-10 – 2022-07-18 (×9): 40 mg via SUBCUTANEOUS
  Filled 2022-07-10 (×9): qty 0.4

## 2022-07-10 MED ORDER — PIPERACILLIN-TAZOBACTAM 3.375 G IVPB 30 MIN: 3.3750 g | Freq: Three times a day (TID) | INTRAVENOUS | Status: DC

## 2022-07-10 MED ORDER — OXYCODONE HCL 5 MG PO TABS
5.0000 mg | ORAL_TABLET | ORAL | Status: DC | PRN
  Administered 2022-07-10 – 2022-07-19 (×23): 5 mg via ORAL
  Filled 2022-07-10 (×23): qty 1

## 2022-07-10 MED ORDER — IOHEXOL 300 MG/ML  SOLN
75.0000 mL | Freq: Once | INTRAMUSCULAR | Status: AC | PRN
  Administered 2022-07-10: 75 mL via INTRAVENOUS

## 2022-07-10 MED ORDER — IPRATROPIUM-ALBUTEROL 0.5-2.5 (3) MG/3ML IN SOLN: 3.0000 mL | RESPIRATORY_TRACT | Status: DC | PRN

## 2022-07-10 MED ORDER — ACETAMINOPHEN 650 MG RE SUPP: 650.0000 mg | Freq: Four times a day (QID) | RECTAL | Status: DC | PRN

## 2022-07-10 MED ORDER — ONDANSETRON HCL 4 MG PO TABS: 4.0000 mg | ORAL_TABLET | Freq: Four times a day (QID) | ORAL | Status: DC | PRN

## 2022-07-10 MED ORDER — VANCOMYCIN HCL 1500 MG/300ML IV SOLN
1500.0000 mg | Freq: Two times a day (BID) | INTRAVENOUS | Status: DC
  Administered 2022-07-10: 1500 mg via INTRAVENOUS
  Filled 2022-07-10 (×2): qty 300

## 2022-07-10 MED ORDER — MAGNESIUM SULFATE 2 GM/50ML IV SOLN
2.0000 g | Freq: Once | INTRAVENOUS | Status: AC
  Administered 2022-07-10: 2 g via INTRAVENOUS
  Filled 2022-07-10: qty 50

## 2022-07-10 MED ORDER — POTASSIUM CHLORIDE CRYS ER 20 MEQ PO TBCR
40.0000 meq | EXTENDED_RELEASE_TABLET | Freq: Once | ORAL | Status: AC
  Administered 2022-07-10: 40 meq via ORAL
  Filled 2022-07-10: qty 2

## 2022-07-10 MED ORDER — SODIUM CHLORIDE 0.9 % IV SOLN
2.0000 g | Freq: Once | INTRAVENOUS | Status: AC
  Filled 2022-07-10: qty 12.5

## 2022-07-10 MED ORDER — ACETAMINOPHEN 325 MG PO TABS
650.0000 mg | ORAL_TABLET | Freq: Four times a day (QID) | ORAL | Status: DC | PRN
  Administered 2022-07-10 – 2022-07-19 (×19): 650 mg via ORAL
  Filled 2022-07-10 (×18): qty 2

## 2022-07-10 MED ORDER — VANCOMYCIN HCL 1500 MG/300ML IV SOLN
1500.0000 mg | Freq: Once | INTRAVENOUS | Status: AC
  Administered 2022-07-10: 1500 mg via INTRAVENOUS
  Filled 2022-07-10: qty 300

## 2022-07-10 MED ORDER — SODIUM CHLORIDE 0.9% FLUSH
10.0000 mL | Freq: Three times a day (TID) | INTRAVENOUS | Status: DC
  Administered 2022-07-10 – 2022-07-17 (×17): 10 mL via INTRAPLEURAL

## 2022-07-10 MED ORDER — FENTANYL CITRATE PF 50 MCG/ML IJ SOSY
50.0000 ug | PREFILLED_SYRINGE | INTRAMUSCULAR | Status: DC | PRN
  Administered 2022-07-14: 50 ug via INTRAVENOUS

## 2022-07-10 NOTE — H&P (Signed)
History and Physical    Patient: Wayne Craig [id number]:[id number] DOB: 1976-06-12 DOA: 07/10/2022 DOS: the patient was seen and examined on 07/10/2022 PCP: Patient, No Pcp Per  Patient coming from: Home  Chief Complaint:  Chief Complaint  Patient presents with   Abdominal Pain   Pneumonia   HPI: Wayne Craig is a 47 y.o. male with no significant past medical history who apparently had RSV infection earlier this month after being in contact with a sick family member.  He was diagnosed with pneumonia 10 days ago and was given oral antibiotics (Augmentin and doxycycline) without significant improvement.  He presented earlier today with epigastric abdominal pain, dyspnea, cough and pleuritic chest pain. He had rhinorrhea, sore throat about 2 to 3 weeks ago.  Occasional wheezing, but no hemoptysis.  No palpitations, diaphoresis, PND, orthopnea or pitting edema of the lower extremities.  No  nausea, emesis, diarrhea, constipation, melena or hematochezia.  No flank pain, dysuria, frequency or hematuria.  No polyuria, polydipsia, polyphagia or blurred vision.   ED course: Initial vital signs were temperature 97.6, pulse 104, respirations 20, BP 143/87 mmHg O2 sat 100% on room air.  The patient received vancomycin and cefepime.  I added potassium and magnesium supplementation.  He underwent chest tube insertion by Dr. Halford Chessman.  Lab work: Urinalysis was hazy with proteinuria 100 mg/deciliter and rare bacteria.  CBC showed a white count 16.2, hemoglobin 15.1 g/dL platelets 593.  Lipase was 63.  CMP with a sodium of 133, glucose 169 and bilirubin 1.4 mg deciliter.  Albumin was 2.9 g/deciliter.  The rest of the LFTs were normal.  Lactic acid 2.0 then 1.2 mmol/L.  Negative viral PCR.  Normal BNP.  Imaging: Large left pleural effusion with only small volume Larissia ligated left lung.  CT chest with contrast with same findings but also showing left lower lobe with suggesting pulmonary necrosis.  There were cavitary  lesions in the medial aspect of the right lower lobe.   Review of Systems: As mentioned in the history of present illness. All other systems reviewed and are negative.  History reviewed. No pertinent past medical history. Past Surgical History:  Procedure Laterality Date   skull surger     Social History:  reports that he has been smoking. He does not have any smokeless tobacco history on file. He reports current alcohol use. No history on file for drug use.  No Known Allergies  History reviewed. No pertinent family history.  Prior to Admission medications   Medication Sig Start Date End Date Taking? Authorizing Provider  amoxicillin-clavulanate (AUGMENTIN) 875-125 MG tablet Take 1 tablet by mouth every 12 (twelve) hours. 06/30/22   Deno Etienne, DO  doxycycline (VIBRAMYCIN) 100 MG capsule Take 1 capsule (100 mg total) by mouth 2 (two) times daily. One po bid x 7 days 06/30/22   Deno Etienne, DO  HYDROcodone-acetaminophen (NORCO/VICODIN) 5-325 MG per tablet Take 1-2 tablets by mouth every 4 (four) hours as needed. 01/13/14   Charlann Lange, PA-C    Physical Exam: Vitals:   07/10/22 0734 07/10/22 0930 07/10/22 1111 07/10/22 1130  BP: (!) 143/87 129/88 126/76 119/76  Pulse: (!) 104 97 100 99  Resp: 20 (!) 26 (!) 22 (!) 21  Temp: 97.6 F (36.4 C)     TempSrc: Axillary     SpO2: 100% 96% 97% 96%  Weight:   88.5 kg   Height:   6' (1.829 m)    Physical Exam Vitals and nursing note reviewed.  Constitutional:  General: He is awake. He is not in acute distress.    Appearance: He is well-developed.  HENT:     Head: Normocephalic.     Nose: No rhinorrhea.     Mouth/Throat:     Mouth: Mucous membranes are dry.  Eyes:     General: No scleral icterus.    Pupils: Pupils are equal, round, and reactive to light.  Neck:     Vascular: No JVD.  Cardiovascular:     Rate and Rhythm: Normal rate and regular rhythm. Frequent Extrasystoles are present.    Heart sounds: S1 normal and S2 normal.   Pulmonary:     Effort: Pulmonary effort is normal.     Breath sounds: Examination of the left-lower field reveals decreased breath sounds. Decreased breath sounds present. No wheezing, rhonchi or rales.  Abdominal:     General: Bowel sounds are normal.     Palpations: Abdomen is soft.     Tenderness: There is abdominal tenderness in the epigastric area. There is no right CVA tenderness, left CVA tenderness, guarding or rebound.  Musculoskeletal:     Cervical back: Neck supple.     Right lower leg: No edema.     Left lower leg: No edema.  Skin:    General: Skin is warm and dry.  Neurological:     General: No focal deficit present.     Mental Status: He is alert and oriented to person, place, and time.  Psychiatric:        Mood and Affect: Mood normal.        Behavior: Behavior normal. Behavior is cooperative.     Data Reviewed:  Results are pending, will review when available.  Assessment and Plan: Principal Problem:   HCAP (healthcare-associated pneumonia) Associated with:   Pleural effusion on left Admit to PCU/inpatient. Continue IV fluids. Continue supplemental oxygen. As needed bronchodilators. Begin Zosyn 3.375 g every 8 hours. Continue vancomycin per pharmacy. Follow-up blood culture and sensitivity Follow CBC and CMP in a.m. PCCM consult appreciated. Continue pleural catheter drainage.  Active Problems:   Moderate protein malnutrition ([place]) In the setting of acute illness. Monitor albumin level. Protein supplementation.    Hyperbilirubinemia Minimally increased. Follow-up repeat level in AM.    Macrocytosis Recheck CBC in AM. Check K81 and folic acid level if elevated.    Hypomagnesemia No significant EtOH use. Replacement ordered.     Advance Care Planning:   Code Status: Full Code   Consults: PCCM Chesley Mires, MD)  Family Communication:   Severity of Illness: The appropriate patient status for this patient is INPATIENT. Inpatient status  is judged to be reasonable and necessary in order to provide the required intensity of service to ensure the patient's safety. The patient's presenting symptoms, physical exam findings, and initial radiographic and laboratory data in the context of their chronic comorbidities is felt to place them at high risk for further clinical deterioration. Furthermore, it is not anticipated that the patient will be medically stable for discharge from the hospital within 2 midnights of admission.   * I certify that at the point of admission it is my clinical judgment that the patient will require inpatient hospital care spanning beyond 2 midnights from the point of admission due to high intensity of service, high risk for further deterioration and high frequency of surveillance required.*  Author: Reubin Milan, MD 07/10/2022 11:46 AM  For on call review [email or web address].   This document was prepared using Systems analyst  and may contain some unintended transcription errors.

## 2022-07-10 NOTE — ED Triage Notes (Addendum)
Pt arrived via POV, c/o abd pain, states dx with PNA and finished abx but has not had any improvement of sx. Pt sx not worsening but no improvement. Denies any n/v or diarrhea.

## 2022-07-10 NOTE — Procedures (Signed)
Insertion of Chest Tube Procedure Note  Wayne Craig  [id number]  03-14-1976  Date:07/10/22  Time:1:22 PM    Provider Performing: Chesley Mires   Procedure: Pleural Catheter Insertion w/o Imaging Guidance [phone])  Indication(s) Effusion  Consent Risks of the procedure as well as the alternatives and risks of each were explained to the patient and/or caregiver.  Consent for the procedure was obtained and is signed in the bedside chart  Anesthesia Topical only with 1% lidocaine    Time Out Verified patient identification, verified procedure, site/side was marked, verified correct patient position, special equipment/implants available, medications/allergies/relevant history reviewed, required imaging and test results available.   Sterile Technique Maximal sterile technique including full sterile barrier drape, hand hygiene, sterile gown, sterile gloves, mask, hair covering, sterile ultrasound probe cover (if used).   Procedure Description Ultrasound not used to identify appropriate pleural anatomy for placement and overlying skin marked. Area of placement cleaned and draped in sterile fashion.  A 13 French pigtail pleural catheter was placed into the left pleural space using Seldinger technique. Appropriate return of fluid was obtained.  The fluid had a fluorescent yellow color and was slightly cloudy.  The tube was connected to atrium and placed on water seal.     Complications/Tolerance None; patient tolerated the procedure well. Chest X-ray is ordered to verify placement.   EBL None  Specimen(s) fluid sent for glucose, protein, LDH, cell count, gram stain and culture, and cytology.  Chesley Mires, MD [place] Pager - [phone] or [phone] 07/10/2022, 1:23 PM

## 2022-07-10 NOTE — ED Provider Notes (Signed)
[facility] EMERGENCY DEPARTMENT AT [facility] Provider Note   CSN: [id number] Arrival date & time: 07/10/22  3016     History  Chief Complaint  Patient presents with   Abdominal Pain   Pneumonia    Wayne Craig is a 47 y.o. male.  HPI      3 weeks ago, severe cough, fever for 2 days, took home covid test which was negative.  Took flu test negative at urgent care, given cough medicines, albuterol, ibuprofen. Then 4-5 days later came here, diagnosed with pneumonia and pleural effusion and dx with abx.   General weakness, fatigue, shortness of breath with laying down flat or more exertion. Did have chest pain but now feeling more abdominal pain than chest.  Abdominal pain on both sides. Not worse with coughing. Felt like needed to urinate badly. Bloating, would go but then not urinate a lot.   Cough improving. No nausea, vomiting. Had diarrhea initially 3 weeks ago that improved.   10 days ago diagnosed with pneumonia, on abx but continuing to feel weak, short of breath. Feeling short of breath laying down, maybe sleeping 2.5 hr per night Yesterday felt ok left side of abdomen  Lives with son and uncle (52) who were sick but got over it but he couldn't  History reviewed. No pertinent past medical history.  No known medical problems. Bp have been a little high recently when checkedbut have not been to a dr much in last 8 yrs until recently.    Smoke, etoh, no other drugs   Home Medications Prior to Admission medications   Medication Sig Start Date End Date Taking? Authorizing Provider  amoxicillin-clavulanate (AUGMENTIN) 875-125 MG tablet Take 1 tablet by mouth every 12 (twelve) hours. 06/30/22   Deno Etienne, DO  doxycycline (VIBRAMYCIN) 100 MG capsule Take 1 capsule (100 mg total) by mouth 2 (two) times daily. One po bid x 7 days 06/30/22   Deno Etienne, DO  HYDROcodone-acetaminophen (NORCO/VICODIN) 5-325 MG per tablet Take 1-2 tablets by mouth every 4 (four) hours  as needed. 01/13/14   Charlann Lange, PA-C      Allergies    Patient has no known allergies.    Review of Systems   Review of Systems  Physical Exam Updated Vital Signs BP 119/76 (BP Location: Left Arm)   Pulse 99   Temp 98 F (36.7 C)   Resp (!) 21   Ht 6' (1.829 m)   Wt 88.5 kg   SpO2 96%   BMI 26.45 kg/m  Physical Exam Vitals and nursing note reviewed.  Constitutional:      General: He is not in acute distress.    Appearance: He is well-developed. He is not diaphoretic.  HENT:     Head: Normocephalic and atraumatic.  Eyes:     Conjunctiva/sclera: Conjunctivae normal.  Cardiovascular:     Rate and Rhythm: Normal rate and regular rhythm.     Heart sounds: Normal heart sounds. No murmur heard.    No friction rub. No gallop.  Pulmonary:     Effort: Pulmonary effort is normal. No respiratory distress.     Breath sounds: Normal breath sounds. No wheezing or rales.     Comments: Diminshed breath sounds on left  Abdominal:     General: There is no distension.     Palpations: Abdomen is soft.     Tenderness: There is no abdominal tenderness. There is no guarding.  Musculoskeletal:     Cervical back: Normal range  of motion.  Skin:    General: Skin is warm and dry.  Neurological:     Mental Status: He is alert and oriented to person, place, and time.     ED Results / Procedures / Treatments   Labs (all labs ordered are listed, but only abnormal results are displayed) Labs Reviewed  CBC WITH DIFFERENTIAL/PLATELET - Abnormal; Notable for the following components:      Result Value   WBC 16.2 (*)    MCV 100.2 (*)    MCH 34.1 (*)    Platelets 593 (*)    Neutro Abs 12.8 (*)    Abs Immature Granulocytes 0.34 (*)    All other components within normal limits  COMPREHENSIVE METABOLIC PANEL - Abnormal; Notable for the following components:   Sodium 133 (*)    Glucose, Bld 169 (*)    Calcium 8.6 (*)    Albumin 2.9 (*)    Total Bilirubin 1.4 (*)    All other  components within normal limits  LIPASE, BLOOD - Abnormal; Notable for the following components:   Lipase 63 (*)    All other components within normal limits  URINALYSIS, ROUTINE W REFLEX MICROSCOPIC - Abnormal; Notable for the following components:   Color, Urine AMBER (*)    APPearance HAZY (*)    Protein, ur 100 (*)    Bacteria, UA RARE (*)    All other components within normal limits  LACTIC ACID, PLASMA - Abnormal; Notable for the following components:   Lactic Acid, Venous 2.0 (*)    All other components within normal limits  RESP PANEL BY RT-PCR (RSV, FLU A&B, COVID)  RVPGX2  CULTURE, BLOOD (ROUTINE X 2)  CULTURE, BLOOD (ROUTINE X 2)  EXPECTORATED SPUTUM ASSESSMENT W GRAM STAIN, RFLX TO RESP C  MRSA NEXT GEN BY PCR, NASAL  BODY FLUID CULTURE W GRAM STAIN  BRAIN NATRIURETIC PEPTIDE  LACTIC ACID, PLASMA  STREP PNEUMONIAE URINARY ANTIGEN  BODY FLUID CELL COUNT WITH DIFFERENTIAL  GLUCOSE, PLEURAL OR PERITONEAL FLUID  LACTATE DEHYDROGENASE, PLEURAL OR PERITONEAL FLUID  PROTEIN, PLEURAL OR PERITONEAL FLUID  CYTOLOGY - NON PAP    EKG EKG Interpretation  Date/Time:  Saturday July 10 2022 08:00:45 EST Ventricular Rate:  93 PR Interval:  132 QRS Duration: 109 QT Interval:  373 QTC Calculation: 464 R Axis:   51 Text Interpretation: Sinus rhythm Borderline T abnormalities, inferior leads Since prior ECG< rate has decreased, PVC new Confirmed by Alvira Monday ([id number]) on 07/10/2022 2:16:29 PM  Radiology DG Chest Port 1 View  Result Date: 07/10/2022 CLINICAL DATA:  Chest tube placement. EXAM: PORTABLE CHEST 1 VIEW COMPARISON:  Same day. FINDINGS: Interval placement of left-sided pleural drainage catheter. Left pleural effusion appears to be significantly smaller. No definite pneumothorax is noted. Right lung is clear. IMPRESSION: Interval placement of left-sided pleural drainage catheter. Left pleural effusion appears to be significantly smaller. Electronically Signed   By:  Lupita Raider M.D.   On: 07/10/2022 13:53   CT Chest W Contrast  Result Date: 07/10/2022 CLINICAL DATA:  Pneumonia and pleural effusion. EXAM: CT CHEST WITH CONTRAST TECHNIQUE: Multidetector CT imaging of the chest was performed during intravenous contrast administration. RADIATION DOSE REDUCTION: This exam was performed according to the departmental dose-optimization program which includes automated exposure control, adjustment of the mA and/or kV according to patient size and/or use of iterative reconstruction technique. CONTRAST:  86mL OMNIPAQUE IOHEXOL 300 MG/ML  SOLN COMPARISON:  Two-view chest x-ray 07/10/2022 FINDINGS: Cardiovascular: Heart size  is normal. Aorta and great vessel origins are normal. Pulmonary arteries are unremarkable. Mediastinum/Nodes: No enlarged mediastinal, hilar, or axillary lymph nodes. Thyroid gland, trachea, and esophagus demonstrate no significant findings. Lungs/Pleura: A large pleural effusion is present. Anterior segment of the left upper lobe is aerated. The lung is otherwise collapsed. The lower lobe demonstrates heterogeneous perfusion and locules of air suggesting pulmonary necrosis. No discrete mass lesion is present. Cavitary lesions are present in the medial aspect of the right lower lobe. The right lung is otherwise clear. No significant right pleural effusion is present. Upper Abdomen: Limited imaging the abdomen is unremarkable. There is no significant adenopathy. No solid organ lesions are present. Musculoskeletal: Vertebral body heights and alignment are normal. No acute or focal osseous lesions are present. IMPRESSION: 1. Large left pleural effusion with collapse of the left lower lobe. Residual segmental aeration in the left upper lobe. 2. Heterogeneous enhancement and locules of air within the lower lobe suggesting pulmonary necrosis. 3. Cavitary lesions in the medial aspect of the right lower lobe compatible with cavitary pneumonia. Atypical agents and fungal  infection should be considered. Septic emboli are also considered. These results were called by telephone at the time of interpretation on 07/10/2022 at 10:56 am to provider [facility] , who verbally acknowledged these results. Electronically Signed   By: San Morelle M.D.   On: 07/10/2022 11:06   DG Chest 2 View  Result Date: 07/10/2022 CLINICAL DATA:  47 year old male with shortness of breath. Productive cough. RSV diagnosis earlier this month. EXAM: CHEST - 2 VIEW COMPARISON:  Chest radiographs 06/30/2022. FINDINGS: PA and lateral views at 0812 hours. Substantially progressed and now large left pleural effusion with only small volume of residual aerated medial left upper lobe. Some mediastinal shift to the right. Contralateral right lung appears fairly clear. Visible mediastinal contours are within normal limits. Visualized tracheal air column is within normal limits. No acute osseous abnormality identified. Negative visible bowel gas. IMPRESSION: Large Left pleural effusion, substantially progressed since 06/30/2022. Only small volume residual aerated left lung. Electronically Signed   By: Genevie Ann M.D.   On: 07/10/2022 08:24    Procedures Procedures    Medications Ordered in ED Medications  enoxaparin (LOVENOX) injection 40 mg (has no administration in time range)  acetaminophen (TYLENOL) tablet 650 mg (has no administration in time range)    Or  acetaminophen (TYLENOL) suppository 650 mg (has no administration in time range)  ondansetron (ZOFRAN) tablet 4 mg (has no administration in time range)    Or  ondansetron (ZOFRAN) injection 4 mg (has no administration in time range)  ipratropium-albuterol (DUONEB) 0.5-2.5 (3) MG/3ML nebulizer solution 3 mL (has no administration in time range)  piperacillin-tazobactam (ZOSYN) IVPB 3.375 g (has no administration in time range)  vancomycin (VANCOREADY) IVPB 1500 mg/300 mL (has no administration in time range)  sodium chloride flush (NS)  0.9 % injection 10 mL (10 mLs Intrapleural Given 07/10/22 1333)  oxyCODONE (Oxy IR/ROXICODONE) immediate release tablet 5 mg (has no administration in time range)  fentaNYL (SUBLIMAZE) injection 50 mcg (has no administration in time range)  vancomycin (VANCOREADY) IVPB 1500 mg/300 mL (0 mg Intravenous Stopped 07/10/22 1347)  ceFEPIme (MAXIPIME) 2 g in sodium chloride 0.9 % 100 mL IVPB (0 g Intravenous Stopped 07/10/22 1139)  iohexol (OMNIPAQUE) 300 MG/ML solution 75 mL (75 mLs Intravenous Contrast Given 07/10/22 1011)    ED Course/ Medical Decision Making/ A&P  47 year old male with history of recent emergency department visit 10 days ago with diagnosis of pneumonia started on oral antibiotics, presents with concern for worsening symptoms of shortness of breath, abdominal discomfort, fatigue and cough.  Chest x-ray was completed and personally about interpreted by me showed a significantly progressed left-sided pleural effusion, with near-total opacification of the left lung.  White blood cells which were 48,000 most recent visit, are now 16,000 today.  Lactic acid with mild elevation of 2.0.  BNP within normal limits.  Normal transaminases, lipase not consistent with pancreatitis.  He is not having nausea, vomiting, not having significant abdominal tenderness on exam and have low suspicion for small bowel obstruction, cholecystitis, appendicitis, diverticulitis.  Cannot visualize the upper abdomen on CT which does not show signs of splenic infarcts.  Possible his abdominal discomfort is due to diaphragmatic irritation from his pleural effusion.  Given no significant tenderness on exam, do not feel that CT imaging of the abdomen is indicated at this time.  CT chest was performed to better evaluate effusion and underlying lesions shows large left pleural effusion with collapse of the left lower lobe, findings concerning for pulmonary necrosis, and cavitary lesions of the  medial aspect of the right lower lobe, with consideration for atypical agents, fungal infection or septic emboli.  Blood cultures were drawn, he was given vancomycin and cefepime.  Consulted hospitalist for admission and pulmonology.  Dr. Craige Cotta pulmonology placed a chest tube at bedside after time of admission.        Final Clinical Impression(s) / ED Diagnoses Final diagnoses:  Cavitary pneumonia  Pleural effusion    Rx / DC Orders ED Discharge Orders     None         Alvira Monday, MD 07/10/22 1422

## 2022-07-10 NOTE — Consult Note (Signed)
NAME:  Wayne Craig, MRN:  [id number], DOB:  1975-08-29, LOS: 0 ADMISSION DATE:  07/10/2022, CONSULTATION DATE:  07/10/2022 REFERRING MD:  Dr. Olevia Bowens, [facility], CHIEF COMPLAINT:  Shortness of breath   History of Present Illness:  47 yo male smoker was seen in the ER on 06/30/22 with fever and cough after being presumptive diagnosis at an urgent care center with RSV.  Chest xray on 06/30/22 showed Lt lower lung field infiltrate and effuson, and he was discharged home with doxycycline and augmentin with plan for follow up with his PCP.  He presented to [facility] ER on 07/10/22 with abdominal pain and reports he recently finished therapy for pneumonia, but wasn't feeling improvement.  CT chest showed a very large left pleural effusion with compressive atelectasis of most of the Lt lung, and cavitary lesions in RLL.  Pertinent  Medical History  None  Significant Hospital Events: Including procedures, antibiotic start and stop dates in addition to other pertinent events   1/27 Admit, pig tail catheter inserted  Interim History / Subjective:  He doesn't seen doctors on regular basis.  He had head injury as a child and had a plate put in.  He smokes 1/2 ppd.  No prior hx of pneumonia.  No exposure to TB.  He denies difficulty swallowing or concern for aspiration.  He has been told that he needed medicine for blood pressure.  Objective   Blood pressure 119/76, pulse 99, temperature 98 F (36.7 C), resp. rate (!) 21, height 6' (1.829 m), weight 88.5 kg, SpO2 96 %.       No intake or output data in the 24 hours ending 07/10/22 1339 Filed Weights   07/10/22 1111  Weight: 88.5 kg    Examination:  General - alert Eyes - pupils reactive ENT - no sinus tenderness, no stridor, poor dentition Cardiac - regular rate/rhythm, no murmur Chest - absent breath sounds on Lt Abdomen - soft, non tender, + bowel sounds Extremities - no cyanosis, clubbing, or edema Skin - no rashes Neuro - normal strength, moves  extremities, follows commands Psych - normal mood and behavior Lymphatics - no lymphadenopathy   Resolved Hospital Problem list     Assessment & Plan:   Cavitary pneumonia with very large left pleural effusion. - likely from silent aspiration in setting of poor dentition - day 1 of ABx - due to large volume of effusion, didn't think thoracentesis would be sufficient and pig tail catheter placed to water seal - will send pleural fluid for glucose, protein, LDH, gram stain and culture, cell count and cytology - follow up chest xray - will need outpt dentistry follow up  Tobacco abuse. - smoking cessation.  Hyperglycemia. Elevated blood pressure. - per primary team   Labs       Latest Ref Rng & Units 07/10/2022    8:03 AM 06/30/2022    8:32 AM  CMP  Glucose 70 - 99 mg/dL 169  219   BUN 6 - 20 mg/dL 13  20   Creatinine 0.61 - 1.24 mg/dL 0.83  1.89   Sodium 135 - 145 mmol/L 133  131   Potassium 3.5 - 5.1 mmol/L 3.5  3.5   Chloride 98 - 111 mmol/L 98  93   CO2 22 - 32 mmol/L 23  24   Calcium 8.9 - 10.3 mg/dL 8.6  8.2   Total Protein 6.5 - 8.1 g/dL 6.8    Total Bilirubin 0.3 - 1.2 mg/dL 1.4    Alkaline  Phos 38 - 126 U/L 87    AST 15 - 41 U/L 16    ALT 0 - 44 U/L 18         Latest Ref Rng & Units 07/10/2022    8:03 AM 06/30/2022    8:32 AM  CBC  WBC 4.0 - 10.5 K/uL 16.2  48.8   Hemoglobin 13.0 - 17.0 g/dL 15.1  15.4   Hematocrit 39.0 - 52.0 % 44.4  44.4   Platelets 150 - 400 K/uL 593  331     CBG (last 3)  No results for input(s): "GLUCAP" in the last 72 hours.   Review of Systems:   Reviewed and negative  Past Medical History:  He,  has no past medical history on file.   Surgical History:   Past Surgical History:  Procedure Laterality Date   Skull Surgery       Social History:   reports that he has been smoking. He does not have any smokeless tobacco history on file. He reports current alcohol use.   Family History:  His denies family history of  tuberculosis.  Allergies No Known Allergies   Home Medications  Prior to Admission medications   Medication Sig Start Date End Date Taking? Authorizing Provider  amoxicillin-clavulanate (AUGMENTIN) 875-125 MG tablet Take 1 tablet by mouth every 12 (twelve) hours. 06/30/22   Deno Etienne, DO  doxycycline (VIBRAMYCIN) 100 MG capsule Take 1 capsule (100 mg total) by mouth 2 (two) times daily. One po bid x 7 days 06/30/22   Deno Etienne, DO  HYDROcodone-acetaminophen (NORCO/VICODIN) 5-325 MG per tablet Take 1-2 tablets by mouth every 4 (four) hours as needed. 01/13/14   Charlann Lange, PA-C     Signature:  Chesley Mires, MD [place] Pager - [phone] or [phone] 07/10/2022, 1:39 PM

## 2022-07-10 NOTE — ED Provider Triage Note (Addendum)
Emergency Medicine Provider Triage Evaluation Note  Wayne Craig , a 47 y.o. male  was evaluated in triage.  Pt complains of generalized abdominal pain. Notes that he was having the abdominal pain when he was last evaluated. Was recently diagnosed with pneumonia and treated with doxy and augmentin and he completed the course. Denies history of diverticulosis or diverticulitis. Still has his gallbladder and appendix. Denies nausea, vomiting, diarrhea, constipation.   Review of Systems  Positive:  Negative:   Physical Exam  BP (!) 143/87 (BP Location: Right Arm)   Pulse (!) 104   Temp 97.6 F (36.4 C) (Axillary)   Resp 20   SpO2 100%  Gen:   Awake, no distress   Resp:  Normal effort  MSK:   Moves extremities without difficulty  Other:    Medical Decision Making  Medically screening exam initiated at 7:37 AM.  Appropriate orders placed.  Joshua Soulier was informed that the remainder of the evaluation will be completed by another provider, this initial triage assessment does not replace that evaluation, and the importance of remaining in the ED until their evaluation is complete.  Work-up initiated.    Lexxie Winberg A, PA-C 07/10/22 0757   8:19 AM - Discussed with RN that patient is in need of a room due to worsening CXR findings. RN aware and working on room placement.    [place], [place] A, PA-C 07/10/22 [phone]

## 2022-07-10 NOTE — Progress Notes (Signed)
   07/10/22 2238  Chest Tube Lateral;Left Pleural  Placement Date/Time: 07/10/22 1330   Inserted prior to hospital arrival?: No  Chest Tube Orientation: Lateral;Left  Chest Tube Location: Pleural  Chest Tube Drainage System: Gravity/Non-Suction Water Seal Drainage  Status To water seal  Chest Tube Air Leak None  Patency Intervention Other (Comment) (no kinks in line)  Drainage Description Serosanguineous;Yellow  Dressing Status Clean, Dry, Intact  Dressing Intervention Other (Comment)  Site Assessment Clean, Dry, Intact;Intact;Dry  Surrounding Skin Dry;Intact  Output (mL) 5 mL    No distress over night. Total output for shift 8 ml

## 2022-07-10 NOTE — Progress Notes (Addendum)
Pharmacy Antibiotic Note  Wayne Craig is a 47 y.o. male admitted on 07/10/2022 with pneumonia.  Pharmacy has been consulted for Vancomycin dosing.  Plan: Zosyn per MD Vancomycin 1500 mg IV x1 then 1500 mg IV q12h  (Scr 0.83, est AUC 454) Measure Vanc levels as needed.  Goal AUC = 400 - 550 Follow up renal function, culture results, and clinical course.   Height: 6' (182.9 cm) Weight: 88.5 kg (195 lb) IBW/kg (Calculated) : 77.6  Temp (24hrs), Avg:97.6 F (36.4 C), Min:97.6 F (36.4 C), Max:97.6 F (36.4 C)  Recent Labs  Lab 07/10/22 0803 07/10/22 0924  WBC 16.2*  --   CREATININE 0.83  --   LATICACIDVEN  --  2.0*    Estimated Creatinine Clearance: 119.5 mL/min (by C-G formula based on SCr of 0.83 mg/dL).    No Known Allergies  Antimicrobials this admission: 1/27 Cefepime x1 1/27 Vancomycin >>  1/27 Zosyn >>   Dose adjustments this admission:   Microbiology results: 1/27 Bcx: 1/27 MRSA PCR:   Thank you for allowing pharmacy to be a part of this patient's care.  Gretta Arab PharmD, BCPS [facility] main pharmacy [phone] 07/10/2022 11:38 AM

## 2022-07-10 NOTE — ED Notes (Signed)
ED TO INPATIENT HANDOFF REPORT  ED Nurse Name and Phone #: Georg Ang Coolidge Breeze, EMT-P  S Name/Age/Gender Wayne Craig 47 y.o. male Room/Bed: WA24/WA24  Code Status   Code Status: Full Code  Home/SNF/Other   Patient oriented to: self, place, time, and situation Is this baseline? Yes   Triage Complete: Triage complete  Chief Complaint HCAP (healthcare-associated pneumonia) [J18.9]  Triage Note Pt arrived via POV, c/o abd pain, states dx with PNA and finished abx but has not had any improvement of sx. Pt sx not worsening but no improvement. Denies any n/v or diarrhea.    Allergies No Known Allergies  Level of Care/Admitting Diagnosis ED Disposition     ED Disposition  Admit   Condition  --   Comment  Hospital Area: [facility] [[id number]]  Level of Care: Progressive [102]  Admit to Progressive based on following criteria: RESPIRATORY PROBLEMS hypoxemic/hypercapnic respiratory failure that is responsive to NIPPV (BiPAP) or High Flow Nasal Cannula (6-80 lpm). Frequent assessment/intervention, no > Q2 hrs < Q4 hrs, to maintain oxygenation and pulmonary hygiene.  May [facility] patient to [facility] if equivalent level of care is available:: No  Covid Evaluation: Asymptomatic - no recent exposure (last 10 days) testing not required  Diagnosis: HCAP (healthcare-associated pneumonia) [027741]  Admitting Physician: Reubin Milan [[id number]]  Attending Physician: Reubin Milan [[id number]]  Certification:: I certify this patient will need inpatient services for at least 2 midnights  Estimated Length of Stay: 3          B Medical/Surgery History History reviewed. No pertinent past medical history. Past Surgical History:  Procedure Laterality Date   Skull Surgery       A IV Location/Drains/Wounds Patient Lines/Drains/Airways Status     Active Line/Drains/Airways     Name Placement date Placement time Site Days   Peripheral IV  07/10/22 20 G Anterior;Right Forearm 07/10/22  0957  Forearm  less than 1            Intake/Output Last 24 hours No intake or output data in the 24 hours ending 07/10/22 1539  Labs/Imaging Results for orders placed or performed during the hospital encounter of 07/10/22 (from the past 48 hour(s))  Resp panel by RT-PCR (RSV, Flu A&B, Covid) Anterior Nasal Swab     Status: None   Collection Time: 07/10/22  7:55 AM   Specimen: Anterior Nasal Swab  Result Value Ref Range   SARS Coronavirus 2 by RT PCR NEGATIVE NEGATIVE    Comment: (NOTE) SARS-CoV-2 target nucleic acids are NOT DETECTED.  The SARS-CoV-2 RNA is generally detectable in upper respiratory specimens during the acute phase of infection. The lowest concentration of SARS-CoV-2 viral copies this assay can detect is 138 copies/mL. A negative result does not preclude SARS-Cov-2 infection and should not be used as the sole basis for treatment or other patient management decisions. A negative result may occur with  improper specimen collection/handling, submission of specimen other than nasopharyngeal swab, presence of viral mutation(s) within the areas targeted by this assay, and inadequate number of viral copies(<138 copies/mL). A negative result must be combined with clinical observations, patient history, and epidemiological information. The expected result is Negative.  Fact Sheet for Patients:  EntrepreneurPulse.com.au  Fact Sheet for Healthcare Providers:  IncredibleEmployment.be  This test is no t yet approved or cleared by the [place] FDA and  has been authorized for detection and/or diagnosis of SARS-CoV-2 by FDA under an Emergency Use Authorization (EUA).  This EUA will remain  in effect (meaning this test can be used) for the duration of the COVID-19 declaration under Section 564(b)(1) of the Act, 21 U.S.C.section 360bbb-3(b)(1), unless the authorization is terminated  or  revoked sooner.       Influenza A by PCR NEGATIVE NEGATIVE   Influenza B by PCR NEGATIVE NEGATIVE    Comment: (NOTE) The Xpert Xpress SARS-CoV-2/FLU/RSV plus assay is intended as an aid in the diagnosis of influenza from Nasopharyngeal swab specimens and should not be used as a sole basis for treatment. Nasal washings and aspirates are unacceptable for Xpert Xpress SARS-CoV-2/FLU/RSV testing.  Fact Sheet for Patients: BloggerCourse.com  Fact Sheet for Healthcare Providers: SeriousBroker.it  This test is not yet approved or cleared by the [place] FDA and has been authorized for detection and/or diagnosis of SARS-CoV-2 by FDA under an Emergency Use Authorization (EUA). This EUA will remain in effect (meaning this test can be used) for the duration of the COVID-19 declaration under Section 564(b)(1) of the Act, 21 U.S.C. section 360bbb-3(b)(1), unless the authorization is terminated or revoked.     Resp Syncytial Virus by PCR NEGATIVE NEGATIVE    Comment: (NOTE) Fact Sheet for Patients: BloggerCourse.com  Fact Sheet for Healthcare Providers: SeriousBroker.it  This test is not yet approved or cleared by the [place] FDA and has been authorized for detection and/or diagnosis of SARS-CoV-2 by FDA under an Emergency Use Authorization (EUA). This EUA will remain in effect (meaning this test can be used) for the duration of the COVID-19 declaration under Section 564(b)(1) of the Act, 21 U.S.C. section 360bbb-3(b)(1), unless the authorization is terminated or revoked.  Performed at [facility], [address], [place], [place] [address]   Urinalysis, Routine w reflex microscopic -Urine, Clean Catch     Status: Abnormal   Collection Time: 07/10/22  7:55 AM  Result Value Ref Range   Color, Urine AMBER (A) YELLOW    Comment: BIOCHEMICALS MAY BE  AFFECTED BY COLOR   APPearance HAZY (A) CLEAR   Specific Gravity, Urine 1.028 1.005 - 1.030   pH 5.0 5.0 - 8.0   Glucose, UA NEGATIVE NEGATIVE mg/dL   Hgb urine dipstick NEGATIVE NEGATIVE   Bilirubin Urine NEGATIVE NEGATIVE   Ketones, ur NEGATIVE NEGATIVE mg/dL   Protein, ur 737 (A) NEGATIVE mg/dL   Nitrite NEGATIVE NEGATIVE   Leukocytes,Ua NEGATIVE NEGATIVE   RBC / HPF 0-5 0 - 5 RBC/hpf   WBC, UA 11-20 0 - 5 WBC/hpf   Bacteria, UA RARE (A) NONE SEEN   Squamous Epithelial / HPF 0-5 0 - 5 /HPF   Mucus PRESENT    Hyaline Casts, UA PRESENT     Comment: Performed at [facility], [address], [place], [place] [address]  Brain natriuretic peptide     Status: None   Collection Time: 07/10/22  8:03 AM  Result Value Ref Range   B Natriuretic Peptide 63.0 0.0 - 100.0 pg/mL    Comment: Performed at [facility], [address] [place], [place] [address]  CBC with Differential     Status: Abnormal   Collection Time: 07/10/22  8:03 AM  Result Value Ref Range   WBC 16.2 (H) 4.0 - 10.5 K/uL   RBC 4.43 4.22 - 5.81 MIL/uL   Hemoglobin 15.1 13.0 - 17.0 g/dL   HCT 62.7 03.5 - 00.9 %   MCV 100.2 (H) 80.0 - 100.0 fL   MCH 34.1 (H) 26.0 -  34.0 pg   MCHC 34.0 30.0 - 36.0 g/dL   RDW 31.5 17.6 - 16.0 %   Platelets 593 (H) 150 - 400 K/uL   nRBC 0.0 0.0 - 0.2 %   Neutrophils Relative % 79 %   Neutro Abs 12.8 (H) 1.7 - 7.7 K/uL   Lymphocytes Relative 12 %   Lymphs Abs 1.9 0.7 - 4.0 K/uL   Monocytes Relative 6 %   Monocytes Absolute 1.0 0.1 - 1.0 K/uL   Eosinophils Relative 0 %   Eosinophils Absolute 0.1 0.0 - 0.5 K/uL   Basophils Relative 1 %   Basophils Absolute 0.1 0.0 - 0.1 K/uL   Immature Granulocytes 2 %   Abs Immature Granulocytes 0.34 (H) 0.00 - 0.07 K/uL    Comment: Performed at [facility], [address] [place], [place] [address]  Comprehensive metabolic panel     Status: Abnormal   Collection Time: 07/10/22  8:03 AM   Result Value Ref Range   Sodium 133 (L) 135 - 145 mmol/L   Potassium 3.5 3.5 - 5.1 mmol/L   Chloride 98 98 - 111 mmol/L   CO2 23 22 - 32 mmol/L   Glucose, Bld 169 (H) 70 - 99 mg/dL    Comment: Glucose reference range applies only to samples taken after fasting for at least 8 hours.   BUN 13 6 - 20 mg/dL   Creatinine, Ser 6.26 0.61 - 1.24 mg/dL   Calcium 8.6 (L) 8.9 - 10.3 mg/dL   Total Protein 6.8 6.5 - 8.1 g/dL   Albumin 2.9 (L) 3.5 - 5.0 g/dL   AST 16 15 - 41 U/L   ALT 18 0 - 44 U/L   Alkaline Phosphatase 87 38 - 126 U/L   Total Bilirubin 1.4 (H) 0.3 - 1.2 mg/dL   GFR, Estimated >94 >85 mL/min    Comment: (NOTE) Calculated using the CKD-EPI Creatinine Equation (2021)    Anion gap 12 5 - 15    Comment: Performed at [facility], [address] [place], [place] [address]  Lipase, blood     Status: Abnormal   Collection Time: 07/10/22  8:03 AM  Result Value Ref Range   Lipase 63 (H) 11 - 51 U/L    Comment: Performed at [facility], [address] [place], [place] [address]  Lactic acid, plasma     Status: Abnormal   Collection Time: 07/10/22  9:24 AM  Result Value Ref Range   Lactic Acid, Venous 2.0 (HH) 0.5 - 1.9 mmol/L    Comment: CRITICAL RESULT CALLED TO, READ BACK BY AND VERIFIED WITH Michell Heinrich RN @ [phone] 07/10/22. GILBERT, L Performed at [facility], [address] [place], [place] [address]   Lactic acid, plasma     Status: None   Collection Time: 07/10/22 11:24 AM  Result Value Ref Range   Lactic Acid, Venous 1.2 0.5 - 1.9 mmol/L    Comment: Performed at [facility], [address] [place], [place] [address]   DG Chest Port 1 View  Result Date: 07/10/2022 CLINICAL DATA:  Chest tube placement. EXAM: PORTABLE CHEST 1 VIEW COMPARISON:  Same day. FINDINGS: Interval placement of left-sided pleural drainage catheter. Left pleural effusion appears to be significantly smaller. No definite  pneumothorax is noted. Right lung is clear. IMPRESSION: Interval placement of left-sided pleural drainage catheter. Left pleural effusion appears to be significantly smaller. Electronically Signed   By: Lupita Raider  M.D.   On: 07/10/2022 13:53   CT Chest W Contrast  Result Date: 07/10/2022 CLINICAL DATA:  Pneumonia and pleural effusion. EXAM: CT CHEST WITH CONTRAST TECHNIQUE: Multidetector CT imaging of the chest was performed during intravenous contrast administration. RADIATION DOSE REDUCTION: This exam was performed according to the departmental dose-optimization program which includes automated exposure control, adjustment of the mA and/or kV according to patient size and/or use of iterative reconstruction technique. CONTRAST:  5mL OMNIPAQUE IOHEXOL 300 MG/ML  SOLN COMPARISON:  Two-view chest x-ray 07/10/2022 FINDINGS: Cardiovascular: Heart size is normal. Aorta and great vessel origins are normal. Pulmonary arteries are unremarkable. Mediastinum/Nodes: No enlarged mediastinal, hilar, or axillary lymph nodes. Thyroid gland, trachea, and esophagus demonstrate no significant findings. Lungs/Pleura: A large pleural effusion is present. Anterior segment of the left upper lobe is aerated. The lung is otherwise collapsed. The lower lobe demonstrates heterogeneous perfusion and locules of air suggesting pulmonary necrosis. No discrete mass lesion is present. Cavitary lesions are present in the medial aspect of the right lower lobe. The right lung is otherwise clear. No significant right pleural effusion is present. Upper Abdomen: Limited imaging the abdomen is unremarkable. There is no significant adenopathy. No solid organ lesions are present. Musculoskeletal: Vertebral body heights and alignment are normal. No acute or focal osseous lesions are present. IMPRESSION: 1. Large left pleural effusion with collapse of the left lower lobe. Residual segmental aeration in the left upper lobe. 2. Heterogeneous  enhancement and locules of air within the lower lobe suggesting pulmonary necrosis. 3. Cavitary lesions in the medial aspect of the right lower lobe compatible with cavitary pneumonia. Atypical agents and fungal infection should be considered. Septic emboli are also considered. These results were called by telephone at the time of interpretation on 07/10/2022 at 10:56 am to provider [facility] , who verbally acknowledged these results. Electronically Signed   By: Marin Roberts M.D.   On: 07/10/2022 11:06   DG Chest 2 View  Result Date: 07/10/2022 CLINICAL DATA:  47 year old male with shortness of breath. Productive cough. RSV diagnosis earlier this month. EXAM: CHEST - 2 VIEW COMPARISON:  Chest radiographs 06/30/2022. FINDINGS: PA and lateral views at 0812 hours. Substantially progressed and now large left pleural effusion with only small volume of residual aerated medial left upper lobe. Some mediastinal shift to the right. Contralateral right lung appears fairly clear. Visible mediastinal contours are within normal limits. Visualized tracheal air column is within normal limits. No acute osseous abnormality identified. Negative visible bowel gas. IMPRESSION: Large Left pleural effusion, substantially progressed since 06/30/2022. Only small volume residual aerated left lung. Electronically Signed   By: Odessa Fleming M.D.   On: 07/10/2022 08:24    Pending Labs Unresulted Labs (From admission, onward)     Start     Ordered   07/17/22 0500  Creatinine, serum  (enoxaparin (LOVENOX)    CrCl >/= 30 ml/min)  Weekly,   R     Comments: while on enoxaparin therapy    07/10/22 1131   07/12/22 0500  Basic metabolic panel  Daily,   R      07/10/22 1131   07/11/22 0500  HIV Antibody (routine testing w rflx)  (HIV Antibody (Routine testing w reflex) panel)  Tomorrow morning,   R        07/10/22 1131   07/11/22 0500  Comprehensive metabolic panel  Tomorrow morning,   R        07/10/22 1131   07/11/22 0500   CBC  Daily,   R      07/10/22 1131   07/11/22 0500  Creatinine, serum  Daily,   R      07/10/22 1146   07/10/22 1528  Hemoglobin A1c  Add-on,   AD        07/10/22 1527   07/10/22 1527  Phosphorus  Add-on,   AD        07/10/22 1526   07/10/22 1524  Magnesium  Add-on,   AD        07/10/22 1526   07/10/22 1319  Body fluid cell count with differential  Once,   R       Question:  Are there also cytology or pathology orders on this specimen?  Answer:  Yes   07/10/22 1319   07/10/22 1319  Body fluid culture w Gram Stain  Once,   R       Question:  Are there also cytology or pathology orders on this specimen?  Answer:  Yes   07/10/22 1319   07/10/22 1319  Glucose, pleural or peritoneal fluid  Once,   R        07/10/22 1319   07/10/22 1319  Lactate dehydrogenase (pleural or peritoneal fluid)  Once,   R        07/10/22 1319   07/10/22 1319  Protein, pleural or peritoneal fluid  Once,   R        07/10/22 1319   07/10/22 1140  MRSA Next Gen by PCR, Nasal  (MRSA Screening)  Once,   R        07/10/22 1145   07/10/22 1130  Strep pneumoniae urinary antigen  (COPD / Pneumonia / Cellulitis / Lower Extremity Wound)  Once,   R        07/10/22 1131   07/10/22 1130  Expectorated Sputum Assessment w Gram Stain, Rflx to Resp Cult  (COPD / Pneumonia / Cellulitis / Lower Extremity Wound)  Once,   R        07/10/22 1131   07/10/22 0924  Blood culture (routine x 2)  BLOOD CULTURE X 2,   R (with STAT occurrences)      07/10/22 0924            Vitals/Pain Today's Vitals   07/10/22 1130 07/10/22 1318 07/10/22 1415 07/10/22 1530  BP: 119/76  122/73 128/87  Pulse: 99  (!) 104 (!) 101  Resp: (!) 21  (!) 32   Temp:  98 F (36.7 C)    TempSrc:      SpO2: 96%  94% 94%  Weight:      Height:      PainSc:        Isolation Precautions No active isolations  Medications Medications  enoxaparin (LOVENOX) injection 40 mg (has no administration in time range)  acetaminophen (TYLENOL) tablet 650 mg (has  no administration in time range)    Or  acetaminophen (TYLENOL) suppository 650 mg (has no administration in time range)  ondansetron (ZOFRAN) tablet 4 mg (has no administration in time range)    Or  ondansetron (ZOFRAN) injection 4 mg (has no administration in time range)  ipratropium-albuterol (DUONEB) 0.5-2.5 (3) MG/3ML nebulizer solution 3 mL (has no administration in time range)  piperacillin-tazobactam (ZOSYN) IVPB 3.375 g (has no administration in time range)  vancomycin (VANCOREADY) IVPB 1500 mg/300 mL (has no administration in time range)  sodium chloride flush (NS) 0.9 % injection 10 mL (10 mLs Intrapleural Given 07/10/22 1333)  oxyCODONE (  Oxy IR/ROXICODONE) immediate release tablet 5 mg (has no administration in time range)  fentaNYL (SUBLIMAZE) injection 50 mcg (has no administration in time range)  potassium chloride SA (KLOR-CON M) CR tablet 40 mEq (has no administration in time range)  vancomycin (VANCOREADY) IVPB 1500 mg/300 mL (0 mg Intravenous Stopped 07/10/22 1347)  ceFEPIme (MAXIPIME) 2 g in sodium chloride 0.9 % 100 mL IVPB (0 g Intravenous Stopped 07/10/22 1139)  iohexol (OMNIPAQUE) 300 MG/ML solution 75 mL (75 mLs Intravenous Contrast Given 07/10/22 1011)    Mobility walks     Focused Assessments     R Recommendations: See Admitting Provider Note  Report given to:   Additional Notes:

## 2022-07-10 NOTE — Progress Notes (Signed)
A consult was received from an ED physician for vancomycin and cefepime per pharmacy dosing (for an indication other than meningitis). The patient's profile has been reviewed for ht/wt/allergies/indication/available labs. A one time order has been placed for the above antibiotics.  Further antibiotics/pharmacy consults should be ordered by admitting physician if indicated.                       Reuel Boom, PharmD, BCPS [phone] 07/10/2022, 9:57 AM

## 2022-07-11 ENCOUNTER — Inpatient Hospital Stay (HOSPITAL_COMMUNITY): Payer: BC Managed Care – PPO

## 2022-07-11 DIAGNOSIS — J984 Other disorders of lung: Secondary | ICD-10-CM | POA: Diagnosis not present

## 2022-07-11 DIAGNOSIS — J189 Pneumonia, unspecified organism: Secondary | ICD-10-CM | POA: Diagnosis not present

## 2022-07-11 DIAGNOSIS — J90 Pleural effusion, not elsewhere classified: Secondary | ICD-10-CM | POA: Diagnosis not present

## 2022-07-11 LAB — COMPREHENSIVE METABOLIC PANEL
ALT: 18 U/L (ref 0–44)
AST: 13 U/L — ABNORMAL LOW (ref 15–41)
Albumin: 2.7 g/dL — ABNORMAL LOW (ref 3.5–5.0)
Alkaline Phosphatase: 98 U/L (ref 38–126)
Anion gap: 10 (ref 5–15)
BUN: 13 mg/dL (ref 6–20)
CO2: 23 mmol/L (ref 22–32)
Calcium: 8.3 mg/dL — ABNORMAL LOW (ref 8.9–10.3)
Chloride: 102 mmol/L (ref 98–111)
Creatinine, Ser: 0.85 mg/dL (ref 0.61–1.24)
GFR, Estimated: >60 mL/min (ref >60)
Glucose, Bld: 134 mg/dL — ABNORMAL HIGH (ref 70–99)
Potassium: 4 mmol/L (ref 3.5–5.1)
Sodium: 135 mmol/L (ref 135–145)
Total Bilirubin: 2 mg/dL — ABNORMAL HIGH (ref 0.3–1.2)
Total Protein: 6.4 g/dL — ABNORMAL LOW (ref 6.5–8.1)

## 2022-07-11 LAB — CBC
HCT: 43.5 % (ref 39.0–52.0)
Hemoglobin: 14.6 g/dL (ref 13.0–17.0)
MCH: 34 pg (ref 26.0–34.0)
MCHC: 33.6 g/dL (ref 30.0–36.0)
MCV: 101.2 fL — ABNORMAL HIGH (ref 80.0–100.0)
Platelets: 556 10*3/uL — ABNORMAL HIGH (ref 150–400)
RBC: 4.3 MIL/uL (ref 4.22–5.81)
RDW: 13.7 % (ref 11.5–15.5)
WBC: 14.1 10*3/uL — ABNORMAL HIGH (ref 4.0–10.5)
nRBC: 0 % (ref 0.0–0.2)

## 2022-07-11 LAB — GLUCOSE, CAPILLARY
Glucose-Capillary: 201 mg/dL — ABNORMAL HIGH (ref 70–99)
Glucose-Capillary: 112 mg/dL — ABNORMAL HIGH (ref 70–99)

## 2022-07-11 LAB — HIV ANTIBODY (ROUTINE TESTING W REFLEX): HIV Screen 4th Generation wRfx: NONREACTIVE

## 2022-07-11 MED ORDER — PNEUMOCOCCAL 20-VAL CONJ VACC 0.5 ML IM SUSY
0.5000 mL | PREFILLED_SYRINGE | INTRAMUSCULAR | Status: AC
  Administered 2022-07-19: 0.5 mL via INTRAMUSCULAR
  Filled 2022-07-11 (×2): qty 0.5

## 2022-07-11 MED ORDER — INSULIN ASPART 100 UNIT/ML IJ SOLN
0.0000 [IU] | Freq: Three times a day (TID) | INTRAMUSCULAR | Status: DC
  Administered 2022-07-11: 3 [IU] via SUBCUTANEOUS
  Administered 2022-07-12: 1 [IU] via SUBCUTANEOUS
  Administered 2022-07-13: 2 [IU] via SUBCUTANEOUS
  Administered 2022-07-14: 1 [IU] via SUBCUTANEOUS
  Administered 2022-07-14: 2 [IU] via SUBCUTANEOUS
  Administered 2022-07-17 – 2022-07-19 (×2): 1 [IU] via SUBCUTANEOUS

## 2022-07-11 NOTE — Progress Notes (Signed)
PROGRESS NOTE    Wayne Craig  JKK:[id number] DOB: 10/14/1975 DOA: 07/10/2022 PCP: Patient, No Pcp Per   Brief Narrative:  Wayne Craig is a 47 y.o. male with no significant past medical history who apparently had RSV infection earlier this month after being in contact with a sick family member.  He was diagnosed with pneumonia 10 days ago and was given oral antibiotics (Augmentin and doxycycline) without significant improvement.  He presented earlier today with epigastric abdominal pain, dyspnea, cough and pleuritic chest pain. He had rhinorrhea, sore throat about 2 to 3 weeks ago.  Occasional wheezing, but no hemoptysis.  No palpitations, diaphoresis, PND, orthopnea or pitting edema of the lower extremities.  No  nausea, emesis, diarrhea, constipation, melena or hematochezia.  No flank pain, dysuria, frequency or hematuria.  No polyuria, polydipsia, polyphagia or blurred vision.    ED course: Initial vital signs were temperature 97.6, pulse 104, respirations 20, BP 143/87 mmHg O2 sat 100% on room air.  The patient received vancomycin and cefepime.  I added potassium and magnesium supplementation.  He underwent chest tube insertion by Dr. Halford Chessman.   Lab work: Urinalysis was hazy with proteinuria 100 mg/deciliter and rare bacteria.  CBC showed a white count 16.2, hemoglobin 15.1 g/dL platelets 593.  Lipase was 63.  CMP with a sodium of 133, glucose 169 and bilirubin 1.4 mg deciliter.  Albumin was 2.9 g/deciliter.  The rest of the LFTs were normal.  Lactic acid 2.0 then 1.2 mmol/L.  Negative viral PCR.  Normal BNP.   CT chest - Large left pleural effusion with collapse of the left lower lobe. Residual segmental aeration in the left upper lobe. Heterogeneous enhancement and locules of air within the lower lobe suggesting pulmonary necrosis. Cavitary lesions in the medial aspect of the right lower lobe compatible with cavitary pneumonia. Atypical agents and fungal infection should be considered. Septic  emboli are also considered  Assessment & Plan:   Principal Problem:   HCAP (healthcare-associated pneumonia) Active Problems:   Moderate protein malnutrition (HCC)   Hyperbilirubinemia   Macrocytosis   Pleural effusion on left   Hypomagnesemia   #1  Pneumonia-patient admitted with fever and cough failed outpatient treatment with Augmentin and doxycycline 10 days prior to admission to this hospital.  He was found to have a large left pleural effusion with cavitary lesions in the right lower lobes and collapse of the left lower lobe. Continue Zosyn Vancomycin.  MRSA PCR negative Pigtail inserted by [facility] 07/10/2022 07/11/2022 chest x-ray- large left pleural effusion with left pleural drain still in place.  No substantial interval change. Fluid with elevated LDH 4700, 81 glucose, 97% neutrophils, protein 5 turbid appearing  Follow-up culture and biopsy Leukocytosis improving 14.1 from 16.2 from 48.8  #2 undiagnosed diabetes with an A1c of 7.2 with hyperglycemia Will start SSI Diabetic education Metformin at discharge  #3 poor dentition needs outpatient dental follow-up on discharge  #4 macrocytosis check B12 and folate   Estimated body mass index is 24.55 kg/m as calculated from the following:   Height as of this encounter: 6' (1.829 m).   Weight as of this encounter: 82.1 kg.  DVT prophylaxis: Lovenox Code Status: Full code Family Communication: None Disposition Plan:  Status is: Inpatient    Consultants: PCCM  Procedures: Left chest tube Antimicrobials: Zosyn  Subjective: Still has a wet cough and short of breath Left chest tube in place  Objective: Vitals:   07/10/22 1714 07/10/22 2033 07/10/22 2355 07/11/22 0447  BP:  130/89 (!) 120/58 114/74 125/76  Pulse: 100 (!) 102 89 90  Resp: (!) 27 (!) 21 20 18   Temp: (!) 97.5 F (36.4 C) 98.5 F (36.9 C) 97.8 F (36.6 C) (!) 97.5 F (36.4 C)  TempSrc: Oral Oral  Oral  SpO2:  97% 95% 94%  Weight: 82.1 kg      Height: 6' (1.829 m)       Intake/Output Summary (Last 24 hours) at 07/11/2022 1332 Last data filed at 07/11/2022 0847 Gross per 24 hour  Intake 731.98 ml  Output 2450 ml  Net -1718.02 ml   Filed Weights   07/10/22 1111 07/10/22 1714  Weight: 88.5 kg 82.1 kg    Examination:  General exam: Appears in nad Respiratory system: diminished to auscultation. Respiratory effort normal. Cardiovascular system: S1 & S2 heard, RRR. No JVD, murmurs, rubs, gallops or clicks. No pedal edema. Gastrointestinal system: Abdomen is nondistended, soft and nontender. No organomegaly or masses felt. Normal bowel sounds heard. Central nervous system: Alert and oriented. No focal neurological deficits. Extremities: Symmetric 5 x 5 power.   Data Reviewed: I have personally reviewed following labs and imaging studies  CBC: Recent Labs  Lab 07/10/22 0803 07/11/22 0436  WBC 16.2* 14.1*  NEUTROABS 12.8*  --   HGB 15.1 14.6  HCT 44.4 43.5  MCV 100.2* 101.2*  PLT 593* 556*   Basic Metabolic Panel: Recent Labs  Lab 07/10/22 0803 07/11/22 0436  NA 133* 135  K 3.5 4.0  CL 98 102  CO2 23 23  GLUCOSE 169* 134*  BUN 13 13  CREATININE 0.83 0.85  CALCIUM 8.6* 8.3*  MG 1.6*  --   PHOS 3.5  --    GFR: Estimated Creatinine Clearance: 116.7 mL/min (by C-G formula based on SCr of 0.85 mg/dL). Liver Function Tests: Recent Labs  Lab 07/10/22 0803 07/11/22 0436  AST 16 13*  ALT 18 18  ALKPHOS 87 98  BILITOT 1.4* 2.0*  PROT 6.8 6.4*  ALBUMIN 2.9* 2.7*   Recent Labs  Lab 07/10/22 0803  LIPASE 63*   No results for input(s): "AMMONIA" in the last 168 hours. Coagulation Profile: No results for input(s): "INR", "PROTIME" in the last 168 hours. Cardiac Enzymes: No results for input(s): "CKTOTAL", "CKMB", "CKMBINDEX", "TROPONINI" in the last 168 hours. BNP (last 3 results) No results for input(s): "PROBNP" in the last 8760 hours. HbA1C: Recent Labs    07/10/22 1800  HGBA1C 7.2*    CBG: No results for input(s): "GLUCAP" in the last 168 hours. Lipid Profile: No results for input(s): "CHOL", "HDL", "LDLCALC", "TRIG", "CHOLHDL", "LDLDIRECT" in the last 72 hours. Thyroid Function Tests: No results for input(s): "TSH", "T4TOTAL", "FREET4", "T3FREE", "THYROIDAB" in the last 72 hours. Anemia Panel: No results for input(s): "VITAMINB12", "FOLATE", "FERRITIN", "TIBC", "IRON", "RETICCTPCT" in the last 72 hours. Sepsis Labs: Recent Labs  Lab 07/10/22 0924 07/10/22 1124  LATICACIDVEN 2.0* 1.2    Recent Results (from the past 240 hour(s))  Resp panel by RT-PCR (RSV, Flu A&B, Covid) Anterior Nasal Swab     Status: None   Collection Time: 07/10/22  7:55 AM   Specimen: Anterior Nasal Swab  Result Value Ref Range Status   SARS Coronavirus 2 by RT PCR NEGATIVE NEGATIVE Final    Comment: (NOTE) SARS-CoV-2 target nucleic acids are NOT DETECTED.  The SARS-CoV-2 RNA is generally detectable in upper respiratory specimens during the acute phase of infection. The lowest concentration of SARS-CoV-2 viral copies this assay can detect is 138 copies/mL.  A negative result does not preclude SARS-Cov-2 infection and should not be used as the sole basis for treatment or other patient management decisions. A negative result may occur with  improper specimen collection/handling, submission of specimen other than nasopharyngeal swab, presence of viral mutation(s) within the areas targeted by this assay, and inadequate number of viral copies(<138 copies/mL). A negative result must be combined with clinical observations, patient history, and epidemiological information. The expected result is Negative.  Fact Sheet for Patients:  BloggerCourse.com  Fact Sheet for Healthcare Providers:  SeriousBroker.it  This test is no t yet approved or cleared by the [place] FDA and  has been authorized for detection and/or diagnosis of  SARS-CoV-2 by FDA under an Emergency Use Authorization (EUA). This EUA will remain  in effect (meaning this test can be used) for the duration of the COVID-19 declaration under Section 564(b)(1) of the Act, 21 U.S.C.section 360bbb-3(b)(1), unless the authorization is terminated  or revoked sooner.       Influenza A by PCR NEGATIVE NEGATIVE Final   Influenza B by PCR NEGATIVE NEGATIVE Final    Comment: (NOTE) The Xpert Xpress SARS-CoV-2/FLU/RSV plus assay is intended as an aid in the diagnosis of influenza from Nasopharyngeal swab specimens and should not be used as a sole basis for treatment. Nasal washings and aspirates are unacceptable for Xpert Xpress SARS-CoV-2/FLU/RSV testing.  Fact Sheet for Patients: BloggerCourse.com  Fact Sheet for Healthcare Providers: SeriousBroker.it  This test is not yet approved or cleared by the [place] FDA and has been authorized for detection and/or diagnosis of SARS-CoV-2 by FDA under an Emergency Use Authorization (EUA). This EUA will remain in effect (meaning this test can be used) for the duration of the COVID-19 declaration under Section 564(b)(1) of the Act, 21 U.S.C. section 360bbb-3(b)(1), unless the authorization is terminated or revoked.     Resp Syncytial Virus by PCR NEGATIVE NEGATIVE Final    Comment: (NOTE) Fact Sheet for Patients: BloggerCourse.com  Fact Sheet for Healthcare Providers: SeriousBroker.it  This test is not yet approved or cleared by the [place] FDA and has been authorized for detection and/or diagnosis of SARS-CoV-2 by FDA under an Emergency Use Authorization (EUA). This EUA will remain in effect (meaning this test can be used) for the duration of the COVID-19 declaration under Section 564(b)(1) of the Act, 21 U.S.C. section 360bbb-3(b)(1), unless the authorization is terminated  or revoked.  Performed at [facility], [address], [place], [place] [address]   Blood culture (routine x 2)     Status: None (Preliminary result)   Collection Time: 07/10/22  9:29 AM   Specimen: BLOOD  Result Value Ref Range Status   Specimen Description   Final    BLOOD SITE NOT SPECIFIED Performed at [facility], [address] [place], [place] [address]    Special Requests   Final    BOTTLES DRAWN AEROBIC AND ANAEROBIC Blood Culture results may not be optimal due to an inadequate volume of blood received in culture bottles Performed at [facility], [address] [place], [place] [address]    Culture   Final    NO GROWTH < 24 HOURS Performed at [facility], [address] [place], [place] [address]    Report Status PENDING  Incomplete  Blood culture (routine x 2)     Status: None (Preliminary result)   Collection Time: 07/10/22 10:04 AM   Specimen: BLOOD  Result Value Ref  Range Status   Specimen Description   Final    BLOOD SITE NOT SPECIFIED Performed at [facility] [address], [place], [place] [address]    Special Requests   Final    BOTTLES DRAWN AEROBIC AND ANAEROBIC Blood Culture results may not be optimal due to an inadequate volume of blood received in culture bottles Performed at [facility] [address], [place], [place] [address]    Culture   Final    NO GROWTH < 24 HOURS Performed at [facility] [address] [place], [place] [address]    Report Status PENDING  Incomplete  Expectorated Sputum Assessment w Gram Stain, Rflx to Resp Cult     Status: None   Collection Time: 07/10/22 11:30 AM   Specimen: Expectorated Sputum  Result Value Ref Range Status   Specimen Description EXPECTORATED SPUTUM  Final   Special Requests NONE  Final   Sputum evaluation   Final    Sputum specimen not acceptable for testing.  Please recollect.    NOTIFIED [facility] RN ON 07/07/22 AT 5573 BY MG Performed at [facility], [place] [address], [place], [place] [address]    Report Status 07/10/2022 FINAL  Final  MRSA Next Gen by PCR, Nasal     Status: None   Collection Time: 07/10/22 11:40 AM   Specimen: Nasal Mucosa; Nasal Swab  Result Value Ref Range Status   MRSA by PCR Next Gen NOT DETECTED NOT DETECTED Final    Comment: (NOTE) The GeneXpert MRSA Assay (FDA approved for NASAL specimens only), is one component of a comprehensive MRSA colonization surveillance program. It is not intended to diagnose MRSA infection nor to guide or monitor treatment for MRSA infections. Test performance is not FDA approved in patients less than 65 years old. Performed at [facility], [place] [address] [place], [place] [address]   Body fluid culture w Gram Stain     Status: None (Preliminary result)   Collection Time: 07/10/22  1:19 PM   Specimen: Pleura; Body Fluid  Result Value Ref Range Status   Specimen Description   Final    PLEURAL Performed at [facility] [address] [place], [place] [address]    Special Requests   Final    NONE Performed at [facility], [place] [address], [place], [place] [address]    Gram Stain NO ORGANISMS SEEN NO WBC SEEN   Final   Culture   Final    NO GROWTH < 24 HOURS Performed at [facility], [place] [address], [place], [place] [address]    Report Status PENDING  Incomplete         Radiology Studies: DG Chest Port 1 View  Result Date: 07/11/2022 CLINICAL DATA:  Left pleural effusion. EXAM: PORTABLE CHEST 1 VIEW COMPARISON:  07/10/2022 FINDINGS: Large left pleural effusion again noted with left pleural drain still in place. Probable component of pleural gas in the lateral left mid hemithorax on today's study. Right lung remains clear. Left heart is obscured by left base collapse/consolidation and left pleural effusion. Telemetry  leads overlie the chest. IMPRESSION: Large left pleural effusion with left pleural drain still in place. No substantial interval change. Electronically Signed   By: Misty Stanley M.D.   On: 07/11/2022 06:52   DG Chest Port 1 View  Result Date: 07/10/2022 CLINICAL DATA:  Chest tube placement. EXAM: PORTABLE CHEST 1 VIEW COMPARISON:  Same  day. FINDINGS: Interval placement of left-sided pleural drainage catheter. Left pleural effusion appears to be significantly smaller. No definite pneumothorax is noted. Right lung is clear. IMPRESSION: Interval placement of left-sided pleural drainage catheter. Left pleural effusion appears to be significantly smaller. Electronically Signed   By: Lupita Raider M.D.   On: 07/10/2022 13:53   CT Chest W Contrast  Result Date: 07/10/2022 CLINICAL DATA:  Pneumonia and pleural effusion. EXAM: CT CHEST WITH CONTRAST TECHNIQUE: Multidetector CT imaging of the chest was performed during intravenous contrast administration. RADIATION DOSE REDUCTION: This exam was performed according to the departmental dose-optimization program which includes automated exposure control, adjustment of the mA and/or kV according to patient size and/or use of iterative reconstruction technique. CONTRAST:  68mL OMNIPAQUE IOHEXOL 300 MG/ML  SOLN COMPARISON:  Two-view chest x-ray 07/10/2022 FINDINGS: Cardiovascular: Heart size is normal. Aorta and great vessel origins are normal. Pulmonary arteries are unremarkable. Mediastinum/Nodes: No enlarged mediastinal, hilar, or axillary lymph nodes. Thyroid gland, trachea, and esophagus demonstrate no significant findings. Lungs/Pleura: A large pleural effusion is present. Anterior segment of the left upper lobe is aerated. The lung is otherwise collapsed. The lower lobe demonstrates heterogeneous perfusion and locules of air suggesting pulmonary necrosis. No discrete mass lesion is present. Cavitary lesions are present in the medial aspect of the right lower lobe.  The right lung is otherwise clear. No significant right pleural effusion is present. Upper Abdomen: Limited imaging the abdomen is unremarkable. There is no significant adenopathy. No solid organ lesions are present. Musculoskeletal: Vertebral body heights and alignment are normal. No acute or focal osseous lesions are present. IMPRESSION: 1. Large left pleural effusion with collapse of the left lower lobe. Residual segmental aeration in the left upper lobe. 2. Heterogeneous enhancement and locules of air within the lower lobe suggesting pulmonary necrosis. 3. Cavitary lesions in the medial aspect of the right lower lobe compatible with cavitary pneumonia. Atypical agents and fungal infection should be considered. Septic emboli are also considered. These results were called by telephone at the time of interpretation on 07/10/2022 at 10:56 am to provider [facility] , who verbally acknowledged these results. Electronically Signed   By: Marin Roberts M.D.   On: 07/10/2022 11:06   DG Chest 2 View  Result Date: 07/10/2022 CLINICAL DATA:  47 year old male with shortness of breath. Productive cough. RSV diagnosis earlier this month. EXAM: CHEST - 2 VIEW COMPARISON:  Chest radiographs 06/30/2022. FINDINGS: PA and lateral views at 0812 hours. Substantially progressed and now large left pleural effusion with only small volume of residual aerated medial left upper lobe. Some mediastinal shift to the right. Contralateral right lung appears fairly clear. Visible mediastinal contours are within normal limits. Visualized tracheal air column is within normal limits. No acute osseous abnormality identified. Negative visible bowel gas. IMPRESSION: Large Left pleural effusion, substantially progressed since 06/30/2022. Only small volume residual aerated left lung. Electronically Signed   By: Odessa Fleming M.D.   On: 07/10/2022 08:24        Scheduled Meds:  enoxaparin (LOVENOX) injection  40 mg Subcutaneous Q24H    sodium chloride flush  10 mL Intrapleural Q8H   Continuous Infusions:  piperacillin-tazobactam (ZOSYN)  IV 3.375 g (07/11/22 0844)     LOS: 1 day    Time spent: 38 min  Alwyn Ren, MD 07/11/2022, 1:32 PM

## 2022-07-11 NOTE — Progress Notes (Signed)
Report received and care received. Pt is alert and oriented. Chest tube site is CDI. Chest tube is set to suction per orders no kinks or distress noted. Pt c/o of 5/10 insertion site. Urinal with 375 ml amber urine. Education provided on new ACHS CBG checks. Pt verbalizes understanding.

## 2022-07-11 NOTE — Progress Notes (Signed)
NAME:  Wayne Craig, MRN:  [id number], DOB:  [birth year], LOS: 1 ADMISSION DATE:  07/10/2022, CONSULTATION DATE:  07/10/2022 REFERRING MD:  Dr. Olevia Bowens, [facility], CHIEF COMPLAINT:  Shortness of breath   History of Present Illness:  47 yo male smoker was seen in the ER on 06/30/22 with fever and cough after being presumptive diagnosis at an urgent care center with RSV.  Chest xray on 06/30/22 showed Lt lower lung field infiltrate and effuson, and he was discharged home with doxycycline and augmentin with plan for follow up with his PCP.  He presented to [facility] ER on 07/10/22 with abdominal pain and reports he recently finished therapy for pneumonia, but wasn't feeling improvement.  CT chest showed a very large left pleural effusion with compressive atelectasis of most of the Lt lung, and cavitary lesions in RLL.  Pertinent  Medical History  None  Significant Hospital Events: Including procedures, antibiotic start and stop dates in addition to other pertinent events   1/27 Admit, pig tail catheter inserted  Interim History / Subjective:  Breathing feels better.  About 2.5 liters of fluid out in chest tube.    Objective   Blood pressure 125/76, pulse 90, temperature (!) 97.5 F (36.4 C), temperature source Oral, resp. rate 18, height 6' (1.829 m), weight 82.1 kg, SpO2 94 %.        Intake/Output Summary (Last 24 hours) at 07/11/2022 1032 Last data filed at 07/11/2022 0847 Gross per 24 hour  Intake 731.98 ml  Output 2450 ml  Net -1718.02 ml   Filed Weights   07/10/22 1111 07/10/22 1714  Weight: 88.5 kg 82.1 kg    Examination:  General - alert Eyes - pupils reactive ENT - no sinus tenderness, no stridor Cardiac - regular rate/rhythm, no murmur Chest - better air movement on Lt, pig tail catheter in place Abdomen - soft, non tender, + bowel sounds Extremities - no cyanosis, clubbing, or edema Skin - no rashes Neuro - normal strength, moves extremities, follows commands Psych - normal mood  and behavior  Left pleural fluid 1/27 >> glucose 81, protein 5, LDH 4713, 97% neutrophils  Resolved Hospital Problem list     Assessment & Plan:   Cavitary pneumonia with very large left exudate pleural effusion. - likely from silent aspiration in setting of poor dentition - day 2 of ABx - can d/c vancomycin since MRSA PCR negative - change pig tail to -20 cm suction - f/u CXR 1/29 - f/u pleural fluid cytology and culture from 1/27 - will need outpt dentistry follow up  Tobacco abuse. - smoking cessation.  Hyperglycemia with elevated HbA1C. Elevated blood pressure. Macrocytosis. - per primary team   Labs       Latest Ref Rng & Units 07/11/2022    4:36 AM 07/10/2022    8:03 AM 06/30/2022    8:32 AM  CMP  Glucose 70 - 99 mg/dL 134  169  219   BUN 6 - 20 mg/dL 13  13  20    Creatinine 0.61 - 1.24 mg/dL 0.85  0.83  1.89   Sodium 135 - 145 mmol/L 135  133  131   Potassium 3.5 - 5.1 mmol/L 4.0  3.5  3.5   Chloride 98 - 111 mmol/L 102  98  93   CO2 22 - 32 mmol/L 23  23  24    Calcium 8.9 - 10.3 mg/dL 8.3  8.6  8.2   Total Protein 6.5 - 8.1 g/dL 6.4  6.8  Total Bilirubin 0.3 - 1.2 mg/dL 2.0  1.4    Alkaline Phos 38 - 126 U/L 98  87    AST 15 - 41 U/L 13  16    ALT 0 - 44 U/L 18  18         Latest Ref Rng & Units 07/11/2022    4:36 AM 07/10/2022    8:03 AM 06/30/2022    8:32 AM  CBC  WBC 4.0 - 10.5 K/uL 14.1  16.2  48.8   Hemoglobin 13.0 - 17.0 g/dL 14.6  15.1  15.4   Hematocrit 39.0 - 52.0 % 43.5  44.4  44.4   Platelets 150 - 400 K/uL 556  593  331     CBG (last 3)  No results for input(s): "GLUCAP" in the last 72 hours.   Signature:  Chesley Mires, MD [facility] Pager - [phone] or [phone] 07/11/2022, 10:32 AM

## 2022-07-12 ENCOUNTER — Inpatient Hospital Stay (HOSPITAL_COMMUNITY): Payer: BC Managed Care – PPO

## 2022-07-12 DIAGNOSIS — Z4682 Encounter for fitting and adjustment of non-vascular catheter: Secondary | ICD-10-CM

## 2022-07-12 DIAGNOSIS — J189 Pneumonia, unspecified organism: Secondary | ICD-10-CM | POA: Diagnosis not present

## 2022-07-12 DIAGNOSIS — J690 Pneumonitis due to inhalation of food and vomit: Secondary | ICD-10-CM

## 2022-07-12 DIAGNOSIS — J918 Pleural effusion in other conditions classified elsewhere: Secondary | ICD-10-CM

## 2022-07-12 DIAGNOSIS — J869 Pyothorax without fistula: Secondary | ICD-10-CM

## 2022-07-12 LAB — BASIC METABOLIC PANEL
Anion gap: 11 (ref 5–15)
BUN: 9 mg/dL (ref 6–20)
CO2: 25 mmol/L (ref 22–32)
Calcium: 8.1 mg/dL — ABNORMAL LOW (ref 8.9–10.3)
Chloride: 96 mmol/L — ABNORMAL LOW (ref 98–111)
Creatinine, Ser: 0.89 mg/dL (ref 0.61–1.24)
GFR, Estimated: >60 mL/min (ref >60)
Glucose, Bld: 109 mg/dL — ABNORMAL HIGH (ref 70–99)
Potassium: 4.1 mmol/L (ref 3.5–5.1)
Sodium: 132 mmol/L — ABNORMAL LOW (ref 135–145)

## 2022-07-12 LAB — CBC
HCT: 40.7 % (ref 39.0–52.0)
Hemoglobin: 13.8 g/dL (ref 13.0–17.0)
MCH: 34 pg (ref 26.0–34.0)
MCHC: 33.9 g/dL (ref 30.0–36.0)
MCV: 100.2 fL — ABNORMAL HIGH (ref 80.0–100.0)
Platelets: 546 10*3/uL — ABNORMAL HIGH (ref 150–400)
RBC: 4.06 MIL/uL — ABNORMAL LOW (ref 4.22–5.81)
RDW: 13.3 % (ref 11.5–15.5)
WBC: 16.5 10*3/uL — ABNORMAL HIGH (ref 4.0–10.5)
nRBC: 0 % (ref 0.0–0.2)

## 2022-07-12 LAB — GLUCOSE, CAPILLARY
Glucose-Capillary: 109 mg/dL — ABNORMAL HIGH (ref 70–99)
Glucose-Capillary: 106 mg/dL — ABNORMAL HIGH (ref 70–99)
Glucose-Capillary: 132 mg/dL — ABNORMAL HIGH (ref 70–99)
Glucose-Capillary: 124 mg/dL — ABNORMAL HIGH (ref 70–99)

## 2022-07-12 MED ORDER — SODIUM CHLORIDE (PF) 0.9 % IJ SOLN
10.0000 mg | Freq: Once | INTRAMUSCULAR | Status: AC
  Administered 2022-07-12: 10 mg via INTRAPLEURAL
  Filled 2022-07-12: qty 10

## 2022-07-12 MED ORDER — STERILE WATER FOR INJECTION IJ SOLN
5.0000 mg | Freq: Once | RESPIRATORY_TRACT | Status: AC
  Administered 2022-07-12: 5 mg via INTRAPLEURAL
  Filled 2022-07-12: qty 5

## 2022-07-12 NOTE — Progress Notes (Signed)
   07/12/22 0704  Chest Tube Lateral;Left Pleural  Placement Date/Time: 07/10/22 1330   Inserted prior to hospital arrival?: No  Chest Tube Orientation: Lateral;Left  Chest Tube Location: Pleural  Chest Tube Drainage System: Gravity/Non-Suction Water Seal Drainage  Status -20 cm H2O  Chest Tube Air Leak None  Patency Intervention Flushed with NS (PER ORDER)  Drainage Description Yellow;Serosanguineous  Dressing Status Clean, Dry, Intact  Dressing Intervention Dressing reinforced  Site Assessment Clean, Dry, Intact;Dry;Intact  Surrounding Skin Dry;Intact  Intake (mL) 10 mL  Output (mL) 110 mL

## 2022-07-12 NOTE — Procedures (Signed)
Pleural Fibrinolytic Administration Procedure Note  Chasin Findling  [id number]  Aug 05, 1975  Date:07/12/22  Time:1:51 PM   Provider Performing:Keniesha Adderly Mamie Nick Carlis Abbott   Procedure: Pleural Fibrinolysis Initial day [phone])  Indication(s) Fibrinolysis of complicated pleural effusion  Consent Risks of the procedure as well as the alternatives and risks of each were explained to the patient and/or caregiver.  Consent for the procedure was obtained. RN present during administration of meds.   Anesthesia None   Time Out Verified patient identification, verified procedure, site/side was marked, verified correct patient position, special equipment/implants available, medications/allergies/relevant history reviewed, required imaging and test results available.   Sterile Technique Hand hygiene, gloves   Procedure Description Existing pleural catheter was cleaned and accessed in sterile manner.  10mg  of tPA in 30cc of saline and 5mg  of dornase in 30cc of sterile water were injected into pleural space using existing pleural catheter.  Catheter will be clamped for 1 hour and then placed back to suction.   Complications/Tolerance None; patient tolerated the procedure well.   EBL None   Specimen(s) None  Julian Hy, DO 07/12/22 1:51 PM [facility]  For contact information, see Amion. If no response to pager, please call PCCM consult pager. After hours, 7PM- 7AM, please call Elink.

## 2022-07-12 NOTE — Progress Notes (Signed)
.   Transition of Care [facility]) Screening Note   Patient Details  Name: Kamdin Follett Date of Birth: 1975-12-26   Transition of Care [facility]) CM/SW Contact:    Illene Regulus, LCSW Phone Number: 07/12/2022, 4:32 PM    Transition of Care Department [facility]) has reviewed patient and no TOC needs have been identified at this time. We will continue to monitor patient advancement through interdisciplinary progression rounds. If new patient transition needs arise, please place a TOC consult.

## 2022-07-12 NOTE — Progress Notes (Signed)
PROGRESS NOTE    Wayne Craig  SAY:[id number] DOB: [birth year] DOA: 07/10/2022 PCP: Patient, No Pcp Per   Brief Narrative:  Wayne Craig is a 47 y.o. male with no significant past medical history who apparently had RSV infection earlier this month after being in contact with a sick family member.  He was diagnosed with pneumonia 10 days ago and was given oral antibiotics (Augmentin and doxycycline) without significant improvement.  He presented earlier today with epigastric abdominal pain, dyspnea, cough and pleuritic chest pain. He had rhinorrhea, sore throat about 2 to 3 weeks ago.  Occasional wheezing, but no hemoptysis.  No palpitations, diaphoresis, PND, orthopnea or pitting edema of the lower extremities.  No  nausea, emesis, diarrhea, constipation, melena or hematochezia.  No flank pain, dysuria, frequency or hematuria.  No polyuria, polydipsia, polyphagia or blurred vision.    ED course: Initial vital signs were temperature 97.6, pulse 104, respirations 20, BP 143/87 mmHg O2 sat 100% on room air.  The patient received vancomycin and cefepime.  I added potassium and magnesium supplementation.  He underwent chest tube insertion by Dr. Halford Chessman.   Lab work: Urinalysis was hazy with proteinuria 100 mg/deciliter and rare bacteria.  CBC showed a white count 16.2, hemoglobin 15.1 g/dL platelets 593.  Lipase was 63.  CMP with a sodium of 133, glucose 169 and bilirubin 1.4 mg deciliter.  Albumin was 2.9 g/deciliter.  The rest of the LFTs were normal.  Lactic acid 2.0 then 1.2 mmol/L.  Negative viral PCR.  Normal BNP.   CT chest - Large left pleural effusion with collapse of the left lower lobe. Residual segmental aeration in the left upper lobe. Heterogeneous enhancement and locules of air within the lower lobe suggesting pulmonary necrosis. Cavitary lesions in the medial aspect of the right lower lobe compatible with cavitary pneumonia. Atypical agents and fungal infection should be considered. Septic  emboli are also considered  Assessment & Plan:   Principal Problem:   HCAP (healthcare-associated pneumonia) Active Problems:   Moderate protein malnutrition (HCC)   Hyperbilirubinemia   Macrocytosis   Pleural effusion on left   Hypomagnesemia   #1  Pneumonia-patient admitted with fever and cough failed outpatient treatment with Augmentin and doxycycline 10 days prior to admission to this hospital.  He was found to have a large left pleural effusion with cavitary lesions in the right lower lobes and collapse of the left lower lobe. Chest x-ray from 07/12/2022 with increasing left pleural effusion and left lung atelectasis since prior study despite thoracostomy tube. On Zosyn. Vancomycin dcd  yesterday.  MRSA PCR negative Pigtail inserted by [facility] 07/10/2022 07/11/2022 chest x-ray- large left pleural effusion with left pleural drain still in place.  No substantial interval change. 07/12/2022 chest x-ray with increasing left pleural effusion and left lung atelectasis Fluid with elevated LDH 4700, 81 glucose, 97% neutrophils, protein 5 turbid appearing  Follow-up culture and biopsy PCCM following.  Await their input.  #2 undiagnosed diabetes with an A1c of 7.2 with hyperglycemia Will start SSI Diabetic education Metformin at discharge  #3 poor dentition needs outpatient dental follow-up on discharge  #4 macrocytosis check B12 and folate   Estimated body mass index is 24.55 kg/m as calculated from the following:   Height as of this encounter: 6' (1.829 m).   Weight as of this encounter: 82.1 kg.  DVT prophylaxis: Lovenox Code Status: Full code Family Communication: None Disposition Plan:  Status is: Inpatient    Consultants: PCCM  Procedures: Left chest tube  Antimicrobials: Zosyn  Subjective: He is sitting up in bed he actually feels better than the chest x-ray reports his pleural effusion has increased Objective: Vitals:   07/11/22 1332 07/11/22 1334 07/11/22 2006  07/12/22 0554  BP:  125/71 124/72 121/82  Pulse: 89 84 87 92  Resp:  16 19 20   Temp: (!) 97.5 F (36.4 C) (!) 97.5 F (36.4 C) 97.7 F (36.5 C) 98.1 F (36.7 C)  TempSrc:   Oral Oral  SpO2: 98% 97% 93% 93%  Weight:      Height:        Intake/Output Summary (Last 24 hours) at 07/12/2022 1326 Last data filed at 07/12/2022 0840 Gross per 24 hour  Intake 1221.48 ml  Output 1460 ml  Net -238.52 ml    Filed Weights   07/10/22 1111 07/10/22 1714  Weight: 88.5 kg 82.1 kg    Examination:  General exam: Appears in nad Respiratory system: diminished to auscultation. Respiratory effort normal. Cardiovascular system: S1 & S2 heard, RRR. No JVD, murmurs, rubs, gallops or clicks. No pedal edema. Gastrointestinal system: Abdomen is nondistended, soft and nontender. No organomegaly or masses felt. Normal bowel sounds heard. Central nervous system: Alert and oriented. No focal neurological deficits. Extremities: No edema Data Reviewed: I have personally reviewed following labs and imaging studies  CBC: Recent Labs  Lab 07/10/22 0803 07/11/22 0436 07/12/22 0347  WBC 16.2* 14.1* 16.5*  NEUTROABS 12.8*  --   --   HGB 15.1 14.6 13.8  HCT 44.4 43.5 40.7  MCV 100.2* 101.2* 100.2*  PLT 593* 556* 546*    Basic Metabolic Panel: Recent Labs  Lab 07/10/22 0803 07/11/22 0436 07/12/22 0347  NA 133* 135 132*  K 3.5 4.0 4.1  CL 98 102 96*  CO2 23 23 25   GLUCOSE 169* 134* 109*  BUN 13 13 9   CREATININE 0.83 0.85 0.89  CALCIUM 8.6* 8.3* 8.1*  MG 1.6*  --   --   PHOS 3.5  --   --     GFR: Estimated Creatinine Clearance: 111.4 mL/min (by C-G formula based on SCr of 0.89 mg/dL). Liver Function Tests: Recent Labs  Lab 07/10/22 0803 07/11/22 0436  AST 16 13*  ALT 18 18  ALKPHOS 87 98  BILITOT 1.4* 2.0*  PROT 6.8 6.4*  ALBUMIN 2.9* 2.7*    Recent Labs  Lab 07/10/22 0803  LIPASE 63*    No results for input(s): "AMMONIA" in the last 168 hours. Coagulation Profile: No  results for input(s): "INR", "PROTIME" in the last 168 hours. Cardiac Enzymes: No results for input(s): "CKTOTAL", "CKMB", "CKMBINDEX", "TROPONINI" in the last 168 hours. BNP (last 3 results) No results for input(s): "PROBNP" in the last 8760 hours. HbA1C: Recent Labs    07/10/22 1800  HGBA1C 7.2*    CBG: Recent Labs  Lab 07/11/22 1658 07/11/22 2125 07/12/22 0808 07/12/22 1152  GLUCAP 201* 112* 109* 106*   Lipid Profile: No results for input(s): "CHOL", "HDL", "LDLCALC", "TRIG", "CHOLHDL", "LDLDIRECT" in the last 72 hours. Thyroid Function Tests: No results for input(s): "TSH", "T4TOTAL", "FREET4", "T3FREE", "THYROIDAB" in the last 72 hours. Anemia Panel: No results for input(s): "VITAMINB12", "FOLATE", "FERRITIN", "TIBC", "IRON", "RETICCTPCT" in the last 72 hours. Sepsis Labs: Recent Labs  Lab 07/10/22 0924 07/10/22 1124  LATICACIDVEN 2.0* 1.2     Recent Results (from the past 240 hour(s))  Resp panel by RT-PCR (RSV, Flu A&B, Covid) Anterior Nasal Swab     Status: None   Collection Time: 07/10/22  7:55 AM   Specimen: Anterior Nasal Swab  Result Value Ref Range Status   SARS Coronavirus 2 by RT PCR NEGATIVE NEGATIVE Final    Comment: (NOTE) SARS-CoV-2 target nucleic acids are NOT DETECTED.  The SARS-CoV-2 RNA is generally detectable in upper respiratory specimens during the acute phase of infection. The lowest concentration of SARS-CoV-2 viral copies this assay can detect is 138 copies/mL. A negative result does not preclude SARS-Cov-2 infection and should not be used as the sole basis for treatment or other patient management decisions. A negative result may occur with  improper specimen collection/handling, submission of specimen other than nasopharyngeal swab, presence of viral mutation(s) within the areas targeted by this assay, and inadequate number of viral copies(<138 copies/mL). A negative result must be combined with clinical observations, patient  history, and epidemiological information. The expected result is Negative.  Fact Sheet for Patients:  BloggerCourse.com  Fact Sheet for Healthcare Providers:  SeriousBroker.it  This test is no t yet approved or cleared by the [place] FDA and  has been authorized for detection and/or diagnosis of SARS-CoV-2 by FDA under an Emergency Use Authorization (EUA). This EUA will remain  in effect (meaning this test can be used) for the duration of the COVID-19 declaration under Section 564(b)(1) of the Act, 21 U.S.C.section 360bbb-3(b)(1), unless the authorization is terminated  or revoked sooner.       Influenza A by PCR NEGATIVE NEGATIVE Final   Influenza B by PCR NEGATIVE NEGATIVE Final    Comment: (NOTE) The Xpert Xpress SARS-CoV-2/FLU/RSV plus assay is intended as an aid in the diagnosis of influenza from Nasopharyngeal swab specimens and should not be used as a sole basis for treatment. Nasal washings and aspirates are unacceptable for Xpert Xpress SARS-CoV-2/FLU/RSV testing.  Fact Sheet for Patients: BloggerCourse.com  Fact Sheet for Healthcare Providers: SeriousBroker.it  This test is not yet approved or cleared by the [place] FDA and has been authorized for detection and/or diagnosis of SARS-CoV-2 by FDA under an Emergency Use Authorization (EUA). This EUA will remain in effect (meaning this test can be used) for the duration of the COVID-19 declaration under Section 564(b)(1) of the Act, 21 U.S.C. section 360bbb-3(b)(1), unless the authorization is terminated or revoked.     Resp Syncytial Virus by PCR NEGATIVE NEGATIVE Final    Comment: (NOTE) Fact Sheet for Patients: BloggerCourse.com  Fact Sheet for Healthcare Providers: SeriousBroker.it  This test is not yet approved or cleared by the [place] FDA  and has been authorized for detection and/or diagnosis of SARS-CoV-2 by FDA under an Emergency Use Authorization (EUA). This EUA will remain in effect (meaning this test can be used) for the duration of the COVID-19 declaration under Section 564(b)(1) of the Act, 21 U.S.C. section 360bbb-3(b)(1), unless the authorization is terminated or revoked.  Performed at [facility], [address] [place], [place] [address]   Blood culture (routine x 2)     Status: None (Preliminary result)   Collection Time: 07/10/22  9:29 AM   Specimen: BLOOD  Result Value Ref Range Status   Specimen Description   Final    BLOOD SITE NOT SPECIFIED Performed at [facility], [address] [place], [place] [address]    Special Requests   Final    BOTTLES DRAWN AEROBIC AND ANAEROBIC Blood Culture results may not be optimal due to an inadequate volume of blood received in culture bottles Performed at [facility], [address]  [place], [place] [address]    Culture   Final    NO GROWTH 2 DAYS Performed at [facility], [address], [place], [place] [address]    Report Status PENDING  Incomplete  Blood culture (routine x 2)     Status: None (Preliminary result)   Collection Time: 07/10/22 10:04 AM   Specimen: BLOOD  Result Value Ref Range Status   Specimen Description   Final    BLOOD SITE NOT SPECIFIED Performed at [facility], [address], [place], [place] [address]    Special Requests   Final    BOTTLES DRAWN AEROBIC AND ANAEROBIC Blood Culture results may not be optimal due to an inadequate volume of blood received in culture bottles Performed at [facility], [address], [place], [place] [address]    Culture   Final    NO GROWTH 2 DAYS Performed at [facility], [address] [place], [place] [address]    Report Status PENDING  Incomplete  Expectorated Sputum Assessment w  Gram Stain, Rflx to Resp Cult     Status: None   Collection Time: 07/10/22 11:30 AM   Specimen: Expectorated Sputum  Result Value Ref Range Status   Specimen Description EXPECTORATED SPUTUM  Final   Special Requests NONE  Final   Sputum evaluation   Final    Sputum specimen not acceptable for testing.  Please recollect.   NOTIFIED [facility] RN ON 07/07/22 AT 1544 BY MG Performed at [facility], [address], [place], [place] [address]    Report Status 07/10/2022 FINAL  Final  MRSA Next Gen by PCR, Nasal     Status: None   Collection Time: 07/10/22 11:40 AM   Specimen: Nasal Mucosa; Nasal Swab  Result Value Ref Range Status   MRSA by PCR Next Gen NOT DETECTED NOT DETECTED Final    Comment: (NOTE) The GeneXpert MRSA Assay (FDA approved for NASAL specimens only), is one component of a comprehensive MRSA colonization surveillance program. It is not intended to diagnose MRSA infection nor to guide or monitor treatment for MRSA infections. Test performance is not FDA approved in patients less than 77 years old. Performed at [facility], [address] [place], [place] [address]   Body fluid culture w Gram Stain     Status: None (Preliminary result)   Collection Time: 07/10/22  1:19 PM   Specimen: Pleura; Body Fluid  Result Value Ref Range Status   Specimen Description   Final    PLEURAL Performed at [facility], [address] [place], [place] [address]    Special Requests   Final    NONE Performed at [facility], [address] [place], [place] [address]    Gram Stain NO ORGANISMS SEEN NO WBC SEEN   Final   Culture   Final    NO GROWTH 2 DAYS Performed at [facility], [address], [place], [place] [address]    Report Status PENDING  Incomplete         Radiology Studies: DG Chest Port 1 View  Result Date: 07/12/2022 CLINICAL DATA:  LEFT pleural effusion EXAM: PORTABLE CHEST 1  VIEW COMPARISON:  Portable exam 0547 hours compared to 07/11/2022 FINDINGS: Pigtail LEFT thoracostomy tube again identified. Heart obscured by large LEFT pleural effusion with significant atelectasis of LEFT lung. RIGHT lung clear. No pneumothorax or acute osseous findings. IMPRESSION: Increase in LEFT  pleural effusion and LEFT lung atelectasis since prior study despite thoracostomy tube. Electronically Signed   By: Ulyses Southward M.D.   On: 07/12/2022 07:54   DG Chest Port 1 View  Result Date: 07/11/2022 CLINICAL DATA:  Left pleural effusion. EXAM: PORTABLE CHEST 1 VIEW COMPARISON:  07/10/2022 FINDINGS: Large left pleural effusion again noted with left pleural drain still in place. Probable component of pleural gas in the lateral left mid hemithorax on today's study. Right lung remains clear. Left heart is obscured by left base collapse/consolidation and left pleural effusion. Telemetry leads overlie the chest. IMPRESSION: Large left pleural effusion with left pleural drain still in place. No substantial interval change. Electronically Signed   By: Kennith Center M.D.   On: 07/11/2022 06:52   DG Chest Port 1 View  Result Date: 07/10/2022 CLINICAL DATA:  Chest tube placement. EXAM: PORTABLE CHEST 1 VIEW COMPARISON:  Same day. FINDINGS: Interval placement of left-sided pleural drainage catheter. Left pleural effusion appears to be significantly smaller. No definite pneumothorax is noted. Right lung is clear. IMPRESSION: Interval placement of left-sided pleural drainage catheter. Left pleural effusion appears to be significantly smaller. Electronically Signed   By: Lupita Raider M.D.   On: 07/10/2022 13:53        Scheduled Meds:  alteplase (CATHFLO ACTIVASE) 10 mg in sodium chloride (PF) 0.9 % 30 mL  10 mg Intrapleural Once   And   dornase alfa (PULMOZYME) 5 mg in sterile water (preservative free) 30 mL  5 mg Intrapleural Once   enoxaparin (LOVENOX) injection  40 mg Subcutaneous Q24H   insulin aspart   0-9 Units Subcutaneous TID WC   pneumococcal 20-valent conjugate vaccine  0.5 mL Intramuscular Tomorrow-1000   sodium chloride flush  10 mL Intrapleural Q8H   Continuous Infusions:  piperacillin-tazobactam (ZOSYN)  IV 3.375 g (07/12/22 0915)     LOS: 2 days    Time spent: 38 min  Alwyn Ren, MD 07/12/2022, 1:26 PM

## 2022-07-12 NOTE — Progress Notes (Signed)
NAME:  Wayne Craig, MRN:  [id number], DOB:  [birth year], LOS: 2 ADMISSION DATE:  07/10/2022, CONSULTATION DATE:  07/10/2022 REFERRING MD:  Dr. Olevia Bowens, [facility], CHIEF COMPLAINT:  Shortness of breath   History of Present Illness:  47 yo male smoker was seen in the ER on 06/30/22 with fever and cough after being presumptive diagnosis at an urgent care center with RSV.  Chest xray on 06/30/22 showed Lt lower lung field infiltrate and effuson, and he was discharged home with doxycycline and augmentin with plan for follow up with his PCP.  He presented to [facility] ER on 07/10/22 with abdominal pain and reports he recently finished therapy for pneumonia, but wasn't feeling improvement.  CT chest showed a very large left pleural effusion with compressive atelectasis of most of the Lt lung, and cavitary lesions in RLL.  Pertinent  Medical History  None  Significant Hospital Events: Including procedures, antibiotic start and stop dates in addition to other pertinent events   1/27 Admit, pig tail catheter inserted  Interim History / Subjective:  Feeling well, no complaints.   Objective   Blood pressure 121/82, pulse 92, temperature 98.1 F (36.7 C), temperature source Oral, resp. rate 20, height 6' (1.829 m), weight 82.1 kg, SpO2 93 %.        Intake/Output Summary (Last 24 hours) at 07/12/2022 1209 Last data filed at 07/12/2022 0840 Gross per 24 hour  Intake 1221.48 ml  Output 1460 ml  Net -238.52 ml    Filed Weights   07/10/22 1111 07/10/22 1714  Weight: 88.5 kg 82.1 kg    Examination:  General - middle aged man sitting up in bed HEENT: [place]/AT, eyes anicteric Cardiac - S1S2, RRR Chest - min breath sounds on the left, CTA on the R. Thick chunks, minimal fluid from chest tube Abdomen - soft, NT Extremities - no c/c/e Skin  warm, dry, no rashes Neuro - awake, alert, moving all extremities  Left pleural fluid 1/27 >> glucose 81, protein 5, LDH 4713, 97% neutrophils Pleural fluid culture:  NGTD Blood cultures : NGTD WBC 16.5 CXR personally reviewed> persistent large left effusion with chest tube in place  Pleural fluid cytology: pending  Resolved Hospital Problem list     Assessment & Plan:   Cavitary pneumonia with very large left exudate parapneumonic pleural effusion - likely from silent aspiration in setting of poor dentition - con't antibiotics -con't chest tube to suction -chest tube not adequately evacuating the effusion-- CXR looks worse today. Planning on [place] today. Patient verball consented. Not on [facility] or AP meds, he understands there is a bleeding risk . - ok for chest tube to be off suction to facilitate mobility -follow up cytology when results back -needs to see a dentist  Tobacco abuse. - smoking cessation recommended  Rest of issues per primary team.    Labs       Latest Ref Rng & Units 07/12/2022    3:47 AM 07/11/2022    4:36 AM 07/10/2022    8:03 AM  CMP  Glucose 70 - 99 mg/dL 109  134  169   BUN 6 - 20 mg/dL 9  13  13    Creatinine 0.61 - 1.24 mg/dL 0.89  0.85  0.83   Sodium 135 - 145 mmol/L 132  135  133   Potassium 3.5 - 5.1 mmol/L 4.1  4.0  3.5   Chloride 98 - 111 mmol/L 96  102  98   CO2 22 - 32 mmol/L  25  23  23    Calcium 8.9 - 10.3 mg/dL 8.1  8.3  8.6   Total Protein 6.5 - 8.1 g/dL  6.4  6.8   Total Bilirubin 0.3 - 1.2 mg/dL  2.0  1.4   Alkaline Phos 38 - 126 U/L  98  87   AST 15 - 41 U/L  13  16   ALT 0 - 44 U/L  18  18        Latest Ref Rng & Units 07/12/2022    3:47 AM 07/11/2022    4:36 AM 07/10/2022    8:03 AM  CBC  WBC 4.0 - 10.5 K/uL 16.5  14.1  16.2   Hemoglobin 13.0 - 17.0 g/dL 13.8  14.6  15.1   Hematocrit 39.0 - 52.0 % 40.7  43.5  44.4   Platelets 150 - 400 K/uL 546  556  593     CBG (last 3)  Recent Labs    07/11/22 2125 07/12/22 0808 07/12/22 1152  GLUCAP 112* 109* 106*     Signature:   Julian Hy, DO 07/12/22 1:43 PM  Pulmonary & Critical Care  For contact information, see Amion.  If no response to pager, please call PCCM consult pager. After hours, 7PM- 7AM, please call El[facility].

## 2022-07-12 NOTE — Progress Notes (Signed)
Chest tube opened to suction at 1450 per MD Clark. Pt tolerating well. Chest tube draining yellow/tan/bloody drainage to suction cannister. Will continue to monitor.

## 2022-07-12 NOTE — Progress Notes (Signed)
1150 ml output from chest tube this shift. Marked on collection cannister. Will continue to monitor.

## 2022-07-13 ENCOUNTER — Inpatient Hospital Stay (HOSPITAL_COMMUNITY): Payer: BC Managed Care – PPO

## 2022-07-13 DIAGNOSIS — Z4682 Encounter for fitting and adjustment of non-vascular catheter: Secondary | ICD-10-CM | POA: Diagnosis not present

## 2022-07-13 DIAGNOSIS — J918 Pleural effusion in other conditions classified elsewhere: Secondary | ICD-10-CM | POA: Diagnosis not present

## 2022-07-13 DIAGNOSIS — J189 Pneumonia, unspecified organism: Secondary | ICD-10-CM | POA: Diagnosis not present

## 2022-07-13 LAB — GLUCOSE, CAPILLARY
Glucose-Capillary: 168 mg/dL — ABNORMAL HIGH (ref 70–99)
Glucose-Capillary: 115 mg/dL — ABNORMAL HIGH (ref 70–99)
Glucose-Capillary: 149 mg/dL — ABNORMAL HIGH (ref 70–99)
Glucose-Capillary: 121 mg/dL — ABNORMAL HIGH (ref 70–99)

## 2022-07-13 LAB — BASIC METABOLIC PANEL
Anion gap: 12 (ref 5–15)
BUN: 8 mg/dL (ref 6–20)
CO2: 23 mmol/L (ref 22–32)
Calcium: 8.2 mg/dL — ABNORMAL LOW (ref 8.9–10.3)
Chloride: 96 mmol/L — ABNORMAL LOW (ref 98–111)
Creatinine, Ser: 0.86 mg/dL (ref 0.61–1.24)
GFR, Estimated: >60 mL/min (ref >60)
Glucose, Bld: 129 mg/dL — ABNORMAL HIGH (ref 70–99)
Potassium: 3.6 mmol/L (ref 3.5–5.1)
Sodium: 131 mmol/L — ABNORMAL LOW (ref 135–145)

## 2022-07-13 LAB — CBC
HCT: 41.5 % (ref 39.0–52.0)
Hemoglobin: 13.8 g/dL (ref 13.0–17.0)
MCH: 33.8 pg (ref 26.0–34.0)
MCHC: 33.3 g/dL (ref 30.0–36.0)
MCV: 101.7 fL — ABNORMAL HIGH (ref 80.0–100.0)
Platelets: 567 10*3/uL — ABNORMAL HIGH (ref 150–400)
RBC: 4.08 MIL/uL — ABNORMAL LOW (ref 4.22–5.81)
RDW: 13.2 % (ref 11.5–15.5)
WBC: 12.6 10*3/uL — ABNORMAL HIGH (ref 4.0–10.5)
nRBC: 0 % (ref 0.0–0.2)

## 2022-07-13 LAB — CYTOLOGY - NON PAP

## 2022-07-13 MED ORDER — STERILE WATER FOR INJECTION IJ SOLN
5.0000 mg | Freq: Once | RESPIRATORY_TRACT | Status: AC
  Administered 2022-07-13: 5 mg via INTRAPLEURAL
  Filled 2022-07-13: qty 5

## 2022-07-13 MED ORDER — SODIUM CHLORIDE (PF) 0.9 % IJ SOLN
10.0000 mg | Freq: Once | INTRAMUSCULAR | Status: AC
  Administered 2022-07-13: 10 mg via INTRAPLEURAL
  Filled 2022-07-13: qty 10

## 2022-07-13 NOTE — Progress Notes (Signed)
Output from chest tube since last documented 100 ml. Canister almost full. Changed chest tube collection canister to a new one. No issues. Will continue to monitor.

## 2022-07-13 NOTE — Progress Notes (Signed)
PROGRESS NOTE    Judas Mohammad  UXN:[id number] DOB: June 20, 1975 DOA: 07/10/2022 PCP: Patient, No Pcp Per   Brief Narrative:  Sumeet Geter is a 47 y.o. male with no significant past medical history who apparently had RSV infection earlier this month after being in contact with a sick family member.  He was diagnosed with pneumonia 10 days ago and was given oral antibiotics (Augmentin and doxycycline) without significant improvement.  He presented earlier today with epigastric abdominal pain, dyspnea, cough and pleuritic chest pain. He had rhinorrhea, sore throat about 2 to 3 weeks ago.  Occasional wheezing, but no hemoptysis.  No palpitations, diaphoresis, PND, orthopnea or pitting edema of the lower extremities.  No  nausea, emesis, diarrhea, constipation, melena or hematochezia.  No flank pain, dysuria, frequency or hematuria.  No polyuria, polydipsia, polyphagia or blurred vision.    ED course: Initial vital signs were temperature 97.6, pulse 104, respirations 20, BP 143/87 mmHg O2 sat 100% on room air.  The patient received vancomycin and cefepime.  I added potassium and magnesium supplementation.  He underwent chest tube insertion by Dr. Craige Cotta.   Lab work: Urinalysis was hazy with proteinuria 100 mg/deciliter and rare bacteria.  CBC showed a white count 16.2, hemoglobin 15.1 g/dL platelets 254.  Lipase was 63.  CMP with a sodium of 133, glucose 169 and bilirubin 1.4 mg deciliter.  Albumin was 2.9 g/deciliter.  The rest of the LFTs were normal.  Lactic acid 2.0 then 1.2 mmol/L.  Negative viral PCR.  Normal BNP.   CT chest - Large left pleural effusion with collapse of the left lower lobe. Residual segmental aeration in the left upper lobe. Heterogeneous enhancement and locules of air within the lower lobe suggesting pulmonary necrosis. Cavitary lesions in the medial aspect of the right lower lobe compatible with cavitary pneumonia. Atypical agents and fungal infection should be considered. Septic  emboli are also considered  Assessment & Plan:   Principal Problem:   HCAP (healthcare-associated pneumonia) Active Problems:   Moderate protein malnutrition (HCC)   Hyperbilirubinemia   Macrocytosis   Pleural effusion on left   Hypomagnesemia   Need for management of chest tube   Aspiration pneumonia of left lower lobe (HCC)   Parapneumonic effusion   #1  Pneumonia-patient admitted with fever and cough failed outpatient treatment with Augmentin and doxycycline 10 days prior to admission to this hospital.  He was found to have a large left pleural effusion with cavitary lesions in the right lower lobes and collapse of the left lower lobe. Chest x-ray from 07/12/2022 with increasing left pleural effusion and left lung atelectasis since prior study despite thoracostomy tube.  PCCM did tPA and dornase with improved output from the chest tube.  For second dose of tPA and dornase today On Zosyn. Vancomycin dcd   MRSA PCR negative Pigtail inserted by [employer] 07/10/2022 07/11/2022 chest x-ray- large left pleural effusion with left pleural drain still in place.  No substantial interval change. 07/12/2022 chest x-ray with increasing left pleural effusion and left lung atelectasis 07/13/2022 chest x-ray shows partial improvement in left pleural effusion and left basilar airspace disease Fluid with elevated LDH 4700, 81 glucose, 97% neutrophils, protein 5 turbid appearing  Follow-up culture and biopsy PCCM following.    #2 undiagnosed diabetes with an A1c of 7.2 with hyperglycemia Will start SSI Diabetic education Metformin at discharge  #3 poor dentition needs outpatient dental follow-up on discharge  #4 macrocytosis check B12 and folate   Estimated body mass  index is 24.55 kg/m as calculated from the following:   Height as of this encounter: 6' (1.829 m).   Weight as of this encounter: 82.1 kg.  DVT prophylaxis: Lovenox Code Status: Full code Family Communication: None Disposition Plan:   Status is: Inpatient    Consultants: PCCM  Procedures: Left chest tube Antimicrobials: Zosyn  Subjective: Feels breathing is better after tPA however the pain was so much after the clamp was taken off the tube yesterday.  On room air.  Objective: Vitals:   07/12/22 2101 07/13/22 0500 07/13/22 0627 07/13/22 1518  BP: 119/78  (!) 119/91 117/72  Pulse: 96  88 90  Resp: 18 17 17 15   Temp: 98.3 F (36.8 C)  97.6 F (36.4 C) 97.9 F (36.6 C)  TempSrc: Oral  Oral Oral  SpO2: 94%  94% 95%  Weight:      Height:        Intake/Output Summary (Last 24 hours) at 07/13/2022 1555 Last data filed at 07/13/2022 1300 Gross per 24 hour  Intake 748.7 ml  Output 2800 ml  Net -2051.3 ml    Filed Weights   07/10/22 1111 07/10/22 1714  Weight: 88.5 kg 82.1 kg    Examination:  General exam: Appears in nad Respiratory system: diminished to auscultation. Respiratory effort normal. Cardiovascular system: S1 & S2 heard, RRR. No JVD, murmurs, rubs, gallops or clicks. No pedal edema. Gastrointestinal system: Abdomen is nondistended, soft and nontender. No organomegaly or masses felt. Normal bowel sounds heard. Central nervous system: Alert and oriented. No focal neurological deficits. Extremities: No edema Data Reviewed: I have personally reviewed following labs and imaging studies  CBC: Recent Labs  Lab 07/10/22 0803 07/11/22 0436 07/12/22 0347 07/13/22 0422  WBC 16.2* 14.1* 16.5* 12.6*  NEUTROABS 12.8*  --   --   --   HGB 15.1 14.6 13.8 13.8  HCT 44.4 43.5 40.7 41.5  MCV 100.2* 101.2* 100.2* 101.7*  PLT 593* 556* 546* 567*    Basic Metabolic Panel: Recent Labs  Lab 07/10/22 0803 07/11/22 0436 07/12/22 0347 07/13/22 0422  NA 133* 135 132* 131*  K 3.5 4.0 4.1 3.6  CL 98 102 96* 96*  CO2 23 23 25 23   GLUCOSE 169* 134* 109* 129*  BUN 13 13 9 8   CREATININE 0.83 0.85 0.89 0.86  CALCIUM 8.6* 8.3* 8.1* 8.2*  MG 1.6*  --   --   --   PHOS 3.5  --   --   --      GFR: Estimated Creatinine Clearance: 115.3 mL/min (by C-G formula based on SCr of 0.86 mg/dL). Liver Function Tests: Recent Labs  Lab 07/10/22 0803 07/11/22 0436  AST 16 13*  ALT 18 18  ALKPHOS 87 98  BILITOT 1.4* 2.0*  PROT 6.8 6.4*  ALBUMIN 2.9* 2.7*    Recent Labs  Lab 07/10/22 0803  LIPASE 63*    No results for input(s): "AMMONIA" in the last 168 hours. Coagulation Profile: No results for input(s): "INR", "PROTIME" in the last 168 hours. Cardiac Enzymes: No results for input(s): "CKTOTAL", "CKMB", "CKMBINDEX", "TROPONINI" in the last 168 hours. BNP (last 3 results) No results for input(s): "PROBNP" in the last 8760 hours. HbA1C: Recent Labs    07/10/22 1800  HGBA1C 7.2*    CBG: Recent Labs  Lab 07/12/22 1152 07/12/22 1632 07/12/22 2102 07/13/22 0810 07/13/22 1211  GLUCAP 106* 132* 124* 168* 115*    Lipid Profile: No results for input(s): "CHOL", "HDL", "LDLCALC", "TRIG", "CHOLHDL", "LDLDIRECT"  in the last 72 hours. Thyroid Function Tests: No results for input(s): "TSH", "T4TOTAL", "FREET4", "T3FREE", "THYROIDAB" in the last 72 hours. Anemia Panel: No results for input(s): "VITAMINB12", "FOLATE", "FERRITIN", "TIBC", "IRON", "RETICCTPCT" in the last 72 hours. Sepsis Labs: Recent Labs  Lab 07/10/22 0924 07/10/22 1124  LATICACIDVEN 2.0* 1.2     Recent Results (from the past 240 hour(s))  Resp panel by RT-PCR (RSV, Flu A&B, Covid) Anterior Nasal Swab     Status: None   Collection Time: 07/10/22  7:55 AM   Specimen: Anterior Nasal Swab  Result Value Ref Range Status   SARS Coronavirus 2 by RT PCR NEGATIVE NEGATIVE Final    Comment: (NOTE) SARS-CoV-2 target nucleic acids are NOT DETECTED.  The SARS-CoV-2 RNA is generally detectable in upper respiratory specimens during the acute phase of infection. The lowest concentration of SARS-CoV-2 viral copies this assay can detect is 138 copies/mL. A negative result does not preclude  SARS-Cov-2 infection and should not be used as the sole basis for treatment or other patient management decisions. A negative result may occur with  improper specimen collection/handling, submission of specimen other than nasopharyngeal swab, presence of viral mutation(s) within the areas targeted by this assay, and inadequate number of viral copies(<138 copies/mL). A negative result must be combined with clinical observations, patient history, and epidemiological information. The expected result is Negative.  Fact Sheet for Patients:  EntrepreneurPulse.com.au  Fact Sheet for Healthcare Providers:  IncredibleEmployment.be  This test is no t yet approved or cleared by the [place] FDA and  has been authorized for detection and/or diagnosis of SARS-CoV-2 by FDA under an Emergency Use Authorization (EUA). This EUA will remain  in effect (meaning this test can be used) for the duration of the COVID-19 declaration under Section 564(b)(1) of the Act, 21 U.S.C.section 360bbb-3(b)(1), unless the authorization is terminated  or revoked sooner.       Influenza A by PCR NEGATIVE NEGATIVE Final   Influenza B by PCR NEGATIVE NEGATIVE Final    Comment: (NOTE) The Xpert Xpress SARS-CoV-2/FLU/RSV plus assay is intended as an aid in the diagnosis of influenza from Nasopharyngeal swab specimens and should not be used as a sole basis for treatment. Nasal washings and aspirates are unacceptable for Xpert Xpress SARS-CoV-2/FLU/RSV testing.  Fact Sheet for Patients: EntrepreneurPulse.com.au  Fact Sheet for Healthcare Providers: IncredibleEmployment.be  This test is not yet approved or cleared by the [place] FDA and has been authorized for detection and/or diagnosis of SARS-CoV-2 by FDA under an Emergency Use Authorization (EUA). This EUA will remain in effect (meaning this test can be used) for the duration of  the COVID-19 declaration under Section 564(b)(1) of the Act, 21 U.S.C. section 360bbb-3(b)(1), unless the authorization is terminated or revoked.     Resp Syncytial Virus by PCR NEGATIVE NEGATIVE Final    Comment: (NOTE) Fact Sheet for Patients: EntrepreneurPulse.com.au  Fact Sheet for Healthcare Providers: IncredibleEmployment.be  This test is not yet approved or cleared by the [place] FDA and has been authorized for detection and/or diagnosis of SARS-CoV-2 by FDA under an Emergency Use Authorization (EUA). This EUA will remain in effect (meaning this test can be used) for the duration of the COVID-19 declaration under Section 564(b)(1) of the Act, 21 U.S.C. section 360bbb-3(b)(1), unless the authorization is terminated or revoked.  Performed at [facility], [place] [address] [place], [place] [address]   Blood culture (routine x 2)     Status: None (Preliminary result)  Collection Time: 07/10/22  9:29 AM   Specimen: BLOOD  Result Value Ref Range Status   Specimen Description   Final    BLOOD SITE NOT SPECIFIED Performed at [place] [address] [place], [place] [address]    Special Requests   Final    BOTTLES DRAWN AEROBIC AND ANAEROBIC Blood Culture results may not be optimal due to an inadequate volume of blood received in culture bottles Performed at [place] [address], [place], [place] [address]    Culture   Final    NO GROWTH 3 DAYS Performed at [facility], [place] [address], [place], [place] [address]    Report Status PENDING  Incomplete  Blood culture (routine x 2)     Status: None (Preliminary result)   Collection Time: 07/10/22 10:04 AM   Specimen: BLOOD  Result Value Ref Range Status   Specimen Description   Final    BLOOD SITE NOT SPECIFIED Performed at [place] [facility] [address] [place], [place] [address]     Special Requests   Final    BOTTLES DRAWN AEROBIC AND ANAEROBIC Blood Culture results may not be optimal due to an inadequate volume of blood received in culture bottles Performed at [place] [address], [place], [place] [address]    Culture   Final    NO GROWTH 3 DAYS Performed at [facility], [place] [address] [place], [place] [address]    Report Status PENDING  Incomplete  Expectorated Sputum Assessment w Gram Stain, Rflx to Resp Cult     Status: None   Collection Time: 07/10/22 11:30 AM   Specimen: Expectorated Sputum  Result Value Ref Range Status   Specimen Description EXPECTORATED SPUTUM  Final   Special Requests NONE  Final   Sputum evaluation   Final    Sputum specimen not acceptable for testing.  Please recollect.   NOTIFIED [facility] RN ON 07/07/22 AT [id number] BY MG Performed at [facility], [place] [address], [place], [place] [address]    Report Status 07/10/2022 FINAL  Final  MRSA Next Gen by PCR, Nasal     Status: None   Collection Time: 07/10/22 11:40 AM   Specimen: Nasal Mucosa; Nasal Swab  Result Value Ref Range Status   MRSA by PCR Next Gen NOT DETECTED NOT DETECTED Final    Comment: (NOTE) The GeneXpert MRSA Assay (FDA approved for NASAL specimens only), is one component of a comprehensive MRSA colonization surveillance program. It is not intended to diagnose MRSA infection nor to guide or monitor treatment for MRSA infections. Test performance is not FDA approved in patients less than 7 years old. Performed at [facility], [place] [address], [place], [place] [address]   Body fluid culture w Gram Stain     Status: None (Preliminary result)   Collection Time: 07/10/22  1:19 PM   Specimen: Pleura; Body Fluid  Result Value Ref Range Status   Specimen Description   Final    PLEURAL Performed at [place] [address] [place], [place] [address]    Special Requests   Final     NONE Performed at [facility], [place] [address], [place], [place] [address]    Gram Stain NO ORGANISMS SEEN NO WBC SEEN   Final   Culture   Final    NO GROWTH 3 DAYS Performed at [facility],  [address] [place], [place] [address]    Report Status PENDING  Incomplete         Radiology Studies: DG CHEST PORT 1 VIEW  Result Date: 07/13/2022 CLINICAL DATA:  F/u chest tube EXAM: PORTABLE CHEST - 1 VIEW COMPARISON:  the previous day's study FINDINGS: Pigtail chest tube is partially formed laterally at the left lung base. Some improvement in the left pleural effusion with moderate residual. Partially improved aeration at the left lung base. Right lung remains clear. Heart size difficult to assess due to adjacent opacities. No pneumothorax. Visualized bones unremarkable. IMPRESSION: Partial improvement in left pleural effusion and left basilar airspace disease. Electronically Signed   By: Lucrezia Europe M.D.   On: 07/13/2022 08:06   DG Chest Port 1 View  Result Date: 07/12/2022 CLINICAL DATA:  LEFT pleural effusion EXAM: PORTABLE CHEST 1 VIEW COMPARISON:  Portable exam 4270 hours compared to 07/11/2022 FINDINGS: Pigtail LEFT thoracostomy tube again identified. Heart obscured by large LEFT pleural effusion with significant atelectasis of LEFT lung. RIGHT lung clear. No pneumothorax or acute osseous findings. IMPRESSION: Increase in LEFT pleural effusion and LEFT lung atelectasis since prior study despite thoracostomy tube. Electronically Signed   By: Lavonia Dana M.D.   On: 07/12/2022 07:54        Scheduled Meds:  enoxaparin (LOVENOX) injection  40 mg Subcutaneous Q24H   insulin aspart  0-9 Units Subcutaneous TID WC   pneumococcal 20-valent conjugate vaccine  0.5 mL Intramuscular Tomorrow-1000   sodium chloride flush  10 mL Intrapleural Q8H   Continuous Infusions:  piperacillin-tazobactam (ZOSYN)  IV 3.375 g (07/13/22 0825)     LOS: 3 days    Time spent: 77  min  Georgette Shell, MD 07/13/2022, 3:55 PM

## 2022-07-13 NOTE — Progress Notes (Signed)
NAME:  Wayne Craig, MRN:  [id number], DOB:  03/04/1976, LOS: 3 ADMISSION DATE:  07/10/2022, CONSULTATION DATE:  07/10/2022 REFERRING MD:  Dr. Olevia Bowens, [facility], CHIEF COMPLAINT:  Shortness of breath   History of Present Illness:  47 yo male smoker was seen in the ER on 06/30/22 with fever and cough after being presumptive diagnosis at an urgent care center with RSV.  Chest xray on 06/30/22 showed Lt lower lung field infiltrate and effuson, and he was discharged home with doxycycline and augmentin with plan for follow up with his PCP.  He presented to [facility] ER on 07/10/22 with abdominal pain and reports he recently finished therapy for pneumonia, but wasn't feeling improvement.  CT chest showed a very large left pleural effusion with compressive atelectasis of most of the Lt lung, and cavitary lesions in RLL.  Pertinent  Medical History  None  Significant Hospital Events: Including procedures, antibiotic start and stop dates in addition to other pertinent events   1/27 Admit, pigtail catheter inserted 1/29 first dose of tPA  Interim History / Subjective:  Yesterday had some pain when his chest tube meds were released that was transient for about 2 hours, now resolved.  Objective   Blood pressure (!) 119/91, pulse 88, temperature 97.6 F (36.4 C), temperature source Oral, resp. rate 17, height 6' (1.829 m), weight 82.1 kg, SpO2 94 %.        Intake/Output Summary (Last 24 hours) at 07/13/2022 0954 Last data filed at 07/13/2022 4097 Gross per 24 hour  Intake 298.7 ml  Output 2150 ml  Net -1851.3 ml    Filed Weights   07/10/22 1111 07/10/22 1714  Weight: 88.5 kg 82.1 kg    Examination:  General -middle-age man sitting up in bed no acute distress HEENT: [place]/AT, eyes anicteric Cardiac -S1-S2, regular rate and rhythm Chest -improving aeration in left upper lobe, chest tube with serous output, proteinaceous debris.  No conversational dyspnea.  Clear on the right. Abdomen -soft, nontender,  nondistended Extremities -no peripheral edema, no cyanosis Skin warm, dry, no diffuse rashes Neuro -awake and alert, moving all extremities.  Regular.  Left pleural fluid 1/27 >> glucose 81, protein 5, LDH 4713, 97% neutrophils Pleural fluid culture: NGTD Blood cultures : NGTD WBC 12.6 H/H 13.8/41.5 CXR personally reviewed>improving L opacity, still some basilar residual effusion Pleural fluid cytology: Pending  Resolved Hospital Problem list     Assessment & Plan:   Cavitary pneumonia with very large left exudate parapneumonic pleural effusion - likely from silent aspiration in setting of poor dentition -Continue Zosyn -Continue chest tube to suction.  Okay to come off suction to waterseal to facilitate mobility -Second dose of tPA and dornase today. - Chest x-ray in the morning - Follow-up cytology - Follow cultures until finalized - Needs to see a dentist after discharge.  Tobacco abuse. -Smoking cessation has been recommended.  Rest of care per primary team.    Labs       Latest Ref Rng & Units 07/13/2022    4:22 AM 07/12/2022    3:47 AM 07/11/2022    4:36 AM  CMP  Glucose 70 - 99 mg/dL 129  109  134   BUN 6 - 20 mg/dL 8  9  13    Creatinine 0.61 - 1.24 mg/dL 0.86  0.89  0.85   Sodium 135 - 145 mmol/L 131  132  135   Potassium 3.5 - 5.1 mmol/L 3.6  4.1  4.0   Chloride 98 - 111  mmol/L 96  96  102   CO2 22 - 32 mmol/L 23  25  23    Calcium 8.9 - 10.3 mg/dL 8.2  8.1  8.3   Total Protein 6.5 - 8.1 g/dL   6.4   Total Bilirubin 0.3 - 1.2 mg/dL   2.0   Alkaline Phos 38 - 126 U/L   98   AST 15 - 41 U/L   13   ALT 0 - 44 U/L   18        Latest Ref Rng & Units 07/13/2022    4:22 AM 07/12/2022    3:47 AM 07/11/2022    4:36 AM  CBC  WBC 4.0 - 10.5 K/uL 12.6  16.5  14.1   Hemoglobin 13.0 - 17.0 g/dL 13.8  13.8  14.6   Hematocrit 39.0 - 52.0 % 41.5  40.7  43.5   Platelets 150 - 400 K/uL 567  546  556     CBG (last 3)  Recent Labs    07/12/22 1632 07/12/22 2102  07/13/22 0810  GLUCAP 132* 124* 168*      Signature:   Julian Hy, DO 07/13/22 10:35 AM [facility]  For contact information, see Amion. If no response to pager, please call PCCM consult pager. After hours, 7PM- 7AM, please call El[facility].

## 2022-07-14 ENCOUNTER — Inpatient Hospital Stay (HOSPITAL_COMMUNITY): Payer: BC Managed Care – PPO

## 2022-07-14 DIAGNOSIS — Z4682 Encounter for fitting and adjustment of non-vascular catheter: Secondary | ICD-10-CM | POA: Diagnosis not present

## 2022-07-14 DIAGNOSIS — J189 Pneumonia, unspecified organism: Secondary | ICD-10-CM | POA: Diagnosis not present

## 2022-07-14 DIAGNOSIS — J918 Pleural effusion in other conditions classified elsewhere: Secondary | ICD-10-CM | POA: Diagnosis not present

## 2022-07-14 DIAGNOSIS — J90 Pleural effusion, not elsewhere classified: Secondary | ICD-10-CM | POA: Diagnosis not present

## 2022-07-14 LAB — MAGNESIUM: Magnesium: 1.7 mg/dL (ref 1.7–2.4)

## 2022-07-14 LAB — BASIC METABOLIC PANEL
Anion gap: 12 (ref 5–15)
BUN: 8 mg/dL (ref 6–20)
CO2: 25 mmol/L (ref 22–32)
Calcium: 8.5 mg/dL — ABNORMAL LOW (ref 8.9–10.3)
Chloride: 97 mmol/L — ABNORMAL LOW (ref 98–111)
Creatinine, Ser: 0.87 mg/dL (ref 0.61–1.24)
GFR, Estimated: >60 mL/min (ref >60)
Glucose, Bld: 111 mg/dL — ABNORMAL HIGH (ref 70–99)
Potassium: 3.4 mmol/L — ABNORMAL LOW (ref 3.5–5.1)
Sodium: 134 mmol/L — ABNORMAL LOW (ref 135–145)

## 2022-07-14 LAB — GLUCOSE, CAPILLARY
Glucose-Capillary: 126 mg/dL — ABNORMAL HIGH (ref 70–99)
Glucose-Capillary: 191 mg/dL — ABNORMAL HIGH (ref 70–99)
Glucose-Capillary: 132 mg/dL — ABNORMAL HIGH (ref 70–99)
Glucose-Capillary: 168 mg/dL — ABNORMAL HIGH (ref 70–99)

## 2022-07-14 LAB — BODY FLUID CULTURE W GRAM STAIN
Culture: NO GROWTH
Gram Stain: NONE SEEN

## 2022-07-14 MED ORDER — OXYCODONE HCL 5 MG PO TABS
10.0000 mg | ORAL_TABLET | Freq: Once | ORAL | Status: AC
  Administered 2022-07-14: 10 mg via ORAL
  Filled 2022-07-14: qty 2

## 2022-07-14 MED ORDER — STERILE WATER FOR INJECTION IJ SOLN
5.0000 mg | Freq: Once | RESPIRATORY_TRACT | Status: AC
  Administered 2022-07-14: 5 mg via INTRAPLEURAL
  Filled 2022-07-14: qty 5

## 2022-07-14 MED ORDER — MAGNESIUM OXIDE -MG SUPPLEMENT 400 (240 MG) MG PO TABS
400.0000 mg | ORAL_TABLET | Freq: Every day | ORAL | Status: AC
  Administered 2022-07-14 – 2022-07-15 (×2): 400 mg via ORAL
  Filled 2022-07-14 (×2): qty 1

## 2022-07-14 MED ORDER — POTASSIUM CHLORIDE 20 MEQ PO PACK
40.0000 meq | PACK | Freq: Once | ORAL | Status: AC
  Administered 2022-07-14: 40 meq via ORAL
  Filled 2022-07-14: qty 2

## 2022-07-14 MED ORDER — FENTANYL CITRATE PF 50 MCG/ML IJ SOSY
50.0000 ug | PREFILLED_SYRINGE | Freq: Once | INTRAMUSCULAR | Status: AC | PRN
  Administered 2022-07-14: 50 ug via INTRAVENOUS
  Filled 2022-07-14 (×3): qty 1

## 2022-07-14 MED ORDER — SODIUM CHLORIDE (PF) 0.9 % IJ SOLN
10.0000 mg | Freq: Once | INTRAMUSCULAR | Status: AC
  Administered 2022-07-14: 10 mg via INTRAPLEURAL
  Filled 2022-07-14: qty 10

## 2022-07-14 NOTE — Progress Notes (Addendum)
PROGRESS NOTE    Wayne Craig  SAY:[id number] DOB: 12/28/1975 DOA: 07/10/2022 PCP: Patient, No Pcp Per     Brief Narrative:  no significant past medical history who apparently had RSV infection earlier this month,  He was diagnosed with pneumonia 10 days ago and was given oral antibiotics (Augmentin and doxycycline) without significant improvement.  He presented to the ED due to epigastric abdominal pain, pleuritic chest pain. dyspnea, cough   CT chest with contrast on presentation showed : . Large left pleural effusion with collapse of the left lower lobe. Residual segmental aeration in the left upper lobe. 2. Heterogeneous enhancement and locules of air within the lower lobe suggesting pulmonary necrosis. 3. Cavitary lesions in the medial aspect of the right lower lobe compatible with cavitary pneumonia. Atypical agents and fungal infection should be considered. Septic emboli are also considered.  Subjective:  Reported left side chest tube irritation, no fever, on room air, no hypoxia, no edema  Assessment & Plan:  Principal Problem:   HCAP (healthcare-associated pneumonia) Active Problems:   Moderate protein malnutrition (HCC)   Hyperbilirubinemia   Macrocytosis   Pleural effusion on left   Hypomagnesemia   Need for management of chest tube   Aspiration pneumonia of left lower lobe (HCC)   Parapneumonic effusion    Assessment and Plan:    Cavitary pneumonia on the right Large left pleural effusion with left lower lobe collapse Sepsis presents on admission with hr>90, leukocytosis, pneumonia -Seen by pulmonology, suspect silent aspiration from poor dentition -Status post chest tube placement, cytology negative, pleural fluid culture negative, blood culture negative -Has been on Zosyn since admission, antibiotic duration and chest tube , monitor WBC  -management per pulmonology   Newly diagnosed diabetes, with hyperglycemia A1c 7.2% Blood glucose on  presentation was 219 Started on SSI since admission A.m. fasting blood glucose ranging from 10 9-1 34 Start carb modified diet, diabetes education Plan to discharge on metformin Follow-up with PCP  Hypokalemia/hypomagnesemia Replace K and mag  Thrombocytosis, mild Likely reactive Monitor  Macrocytosis Check b12, tsh, does reports alcohol use Monitor   Tobacco use Smoking cessation education provided Alcohol use, does not appear to have any issues with alcohol withdrawal  Poor dentition, recommend outpatient dental care      I have Reviewed nursing notes, Vitals, pain scores, I/o's, Lab results and  imaging results since pt's last encounter, details please see discussion above  I ordered the following labs:  Unresulted Labs (From admission, onward)     Start     Ordered   07/17/22 0500  Creatinine, serum  (enoxaparin (LOVENOX)    CrCl >/= 30 ml/min)  Weekly,   R     Comments: while on enoxaparin therapy    07/10/22 1131   07/15/22 0500  CBC with Differential/Platelet  Tomorrow morning,   R       Question:  Specimen collection method  Answer:  Lab=Lab collect   07/14/22 1846   07/15/22 2355  Basic metabolic panel  Daily,   R     Question:  Specimen collection method  Answer:  Lab=Lab collect   07/14/22 1846   07/11/22 0500  Creatinine, serum  Daily,   R      07/10/22 1146             DVT prophylaxis: enoxaparin (LOVENOX) injection 40 mg Start: 07/10/22 2200   Code Status:   Code Status: Full Code  Family Communication: Patient Disposition:   Dispo: The patient is  from: Home              Anticipated d/c is to: Home              Anticipated d/c date is: Once cleared by pulmonology  Antimicrobials:   Anti-infectives (From admission, onward)    Start     Dose/Rate Route Frequency Ordered Stop   07/10/22 2200  vancomycin (VANCOREADY) IVPB 1500 mg/300 mL  Status:  Discontinued        1,500 mg 150 mL/hr over 120 Minutes Intravenous Every 12 hours 07/10/22  1145 07/11/22 1032   07/10/22 1800  piperacillin-tazobactam (ZOSYN) IVPB 3.375 g        3.375 g 12.5 mL/hr over 240 Minutes Intravenous Every 8 hours 07/10/22 1133     07/10/22 1400  piperacillin-tazobactam (ZOSYN) IVPB 3.375 g  Status:  Discontinued        3.375 g 100 mL/hr over 30 Minutes Intravenous Every 8 hours 07/10/22 1122 07/10/22 1133   07/10/22 1000  vancomycin (VANCOREADY) IVPB 1500 mg/300 mL        1,500 mg 150 mL/hr over 120 Minutes Intravenous  Once 07/10/22 0955 07/10/22 1347   07/10/22 1000  ceFEPIme (MAXIPIME) 2 g in sodium chloride 0.9 % 100 mL IVPB        2 g 200 mL/hr over 30 Minutes Intravenous  Once 07/10/22 0955 07/10/22 1139           Objective: Vitals:   07/13/22 2004 07/14/22 0000 07/14/22 0439 07/14/22 1333  BP: 127/72 123/70 115/73 134/78  Pulse: 93 90 80 83  Resp: 20  16 18   Temp: 98.9 F (37.2 C) 98.8 F (37.1 C) 98.9 F (37.2 C) 97.6 F (36.4 C)  TempSrc: Oral Oral  Oral  SpO2: 94% 95% 99% 100%  Weight:      Height:        Intake/Output Summary (Last 24 hours) at 07/14/2022 1847 Last data filed at 07/14/2022 1800 Gross per 24 hour  Intake 1230 ml  Output 2520 ml  Net -1290 ml   Filed Weights   07/10/22 1111 07/10/22 1714  Weight: 88.5 kg 82.1 kg    Examination:  General exam: alert, awake, communicative,calm, NAD Respiratory system: left side chest tube, Respiratory effort normal. Cardiovascular system:  RRR.  Gastrointestinal system: Abdomen is nondistended, soft and nontender.  Normal bowel sounds heard. Central nervous system: Alert and oriented. No focal neurological deficits. Extremities:  no edema Skin: No rashes, lesions or ulcers Psychiatry: Judgement and insight appear normal. Mood & affect appropriate.     Data Reviewed: I have personally reviewed  labs and visualized  imaging studies since the last encounter and formulate the plan        Scheduled Meds:  enoxaparin (LOVENOX) injection  40 mg Subcutaneous  Q24H   insulin aspart  0-9 Units Subcutaneous TID WC   magnesium oxide  400 mg Oral Daily   pneumococcal 20-valent conjugate vaccine  0.5 mL Intramuscular Tomorrow-1000   potassium chloride  40 mEq Oral Once   sodium chloride flush  10 mL Intrapleural Q8H   Continuous Infusions:  piperacillin-tazobactam (ZOSYN)  IV 3.375 g (07/14/22 1722)     LOS: 4 days   Time spent:  20mins  Florencia Reasons, MD PhD FACP [facility]  Available via Epic secure chat 7am-7pm for nonurgent issues Please page for urgent issues To page the attending provider between 7A-7P or the covering provider during after hours 7P-7A, please log into the web site  [email or web address] and access using universal  password for that web site. If you do not have the password, please call the hospital operator.    07/14/2022, 6:47 PM

## 2022-07-14 NOTE — Procedures (Signed)
Pleural Fibrinolytic Administration Procedure Note  Kaycee Mcgaugh  [id number]  Apr 15, 1976  Date:07/14/22  Time:4:00 PM   Provider Performing:Markevion Lattin P Carlis Abbott   Procedure: Pleural Fibrinolysis Subsequent day [phone])  Indication(s) Fibrinolysis of complicated pleural effusion  Consent Risks of the procedure as well as the alternatives and risks of each were explained to the patient and/or caregiver.  Consent for the procedure was obtained.   Anesthesia None   Time Out Verified patient identification, verified procedure, site/side was marked, verified correct patient position, special equipment/implants available, medications/allergies/relevant history reviewed, required imaging and test results available.   Sterile Technique Hand hygiene, gloves   Procedure Description Existing pleural catheter was cleaned and accessed in sterile manner.  10mg  of tPA in 30cc of saline and 5mg  of dornase in 30cc of sterile water were injected into pleural space using existing pleural catheter.  Catheter will be clamped for 1 hour and then placed back to suction.   Complications/Tolerance None; patient tolerated the procedure well.   EBL None   Specimen(s) None  Julian Hy, DO 07/14/22 4:00 PM [facility]  For contact information, see Amion. If no response to pager, please call PCCM consult pager. After hours, 7PM- 7AM, please call Elink.

## 2022-07-14 NOTE — Progress Notes (Signed)
NAME:  Wayne Craig, MRN:  [id number], DOB:  10-08-1975, LOS: 4 ADMISSION DATE:  07/10/2022, CONSULTATION DATE:  07/10/2022 REFERRING MD:  Dr. Olevia Bowens, [facility], CHIEF COMPLAINT:  Shortness of breath   History of Present Illness:  47 yo male smoker was seen in the ER on 06/30/22 with fever and cough after being presumptive diagnosis at an urgent care center with RSV.  Chest xray on 06/30/22 showed Lt lower lung field infiltrate and effuson, and he was discharged home with doxycycline and augmentin with plan for follow up with his PCP.  He presented to [facility] ER on 07/10/22 with abdominal pain and reports he recently finished therapy for pneumonia, but wasn't feeling improvement.  CT chest showed a very large left pleural effusion with compressive atelectasis of most of the Lt lung, and cavitary lesions in RLL.  Pertinent  Medical History  None  Significant Hospital Events: Including procedures, antibiotic start and stop dates in addition to other pertinent events   1/27 Admit, pigtail catheter inserted 1/29 first dose of tPA 1/30 2nd dose of TPA/dnase  Interim History / Subjective:  Increased pain yesterday for about 1.5 hrs after tube lytics.  Objective   Blood pressure 115/73, pulse 80, temperature 98.9 F (37.2 C), resp. rate 16, height 6' (1.829 m), weight 82.1 kg, SpO2 99 %.        Intake/Output Summary (Last 24 hours) at 07/14/2022 1329 Last data filed at 07/14/2022 0900 Gross per 24 hour  Intake 650 ml  Output 1480 ml  Net -830 ml    Filed Weights   07/10/22 1111 07/10/22 1714  Weight: 88.5 kg 82.1 kg    Examination:  General -middle aged man lying in bed in NAD HEENT: [place]/AT, eyes anicteric Cardiac -S1S2, RRR Chest -purulent drainage from chest tube, no pleural rub today, improving aeration of the L lung. CTA on the R. Abdomen -nondistended Extremities -no clubbing or cyanosis Skin warm, dry, no diffuse rashes Neuro -awake and alert, answering questions appropriately, no  focal deficits.  Left pleural fluid 1/27 >> glucose 81, protein 5, LDH 4713, 97% neutrophils Pleural fluid culture: No growth, final Blood cultures : No growth to date CXR personally reviewed>residual small inferior infusion, resolving opacity in left lung, pigtail remains in place  Cytology-no malignant cells  Resolved Hospital Problem list     Assessment & Plan:   Cavitary pneumonia with very large left exudate parapneumonic pleural effusion - likely from silent aspiration in setting of poor dentition.  Cytology negative, no suspicion for malignancy in this case. -Continue Zosyn - Third dose of tPA and dornase today.  Nurse will release meds in 1 hour. -Planning to get a CT  scan of his chest tomorrow to evaluate the volume of residual fluid. - Continue chest tube to suction.  Okay to go to waterseal to facilitate mobility. -Needs to follow-up with a dentist after discharge  Tobacco abuse. -Recommend smoking cessation  Rest of care per primary team.    Labs       Latest Ref Rng & Units 07/14/2022    4:29 AM 07/13/2022    4:22 AM 07/12/2022    3:47 AM  CMP  Glucose 70 - 99 mg/dL 111  129  109   BUN 6 - 20 mg/dL 8  8  9    Creatinine 0.61 - 1.24 mg/dL 0.87  0.86  0.89   Sodium 135 - 145 mmol/L 134  131  132   Potassium 3.5 - 5.1 mmol/L 3.4  3.6  4.1   Chloride 98 - 111 mmol/L 97  96  96   CO2 22 - 32 mmol/L 25  23  25    Calcium 8.9 - 10.3 mg/dL 8.5  8.2  8.1        Latest Ref Rng & Units 07/13/2022    4:22 AM 07/12/2022    3:47 AM 07/11/2022    4:36 AM  CBC  WBC 4.0 - 10.5 K/uL 12.6  16.5  14.1   Hemoglobin 13.0 - 17.0 g/dL 13.8  13.8  14.6   Hematocrit 39.0 - 52.0 % 41.5  40.7  43.5   Platelets 150 - 400 K/uL 567  546  556     CBG (last 3)  Recent Labs    07/13/22 2052 07/14/22 0755 07/14/22 1109  GLUCAP 121* 126* 191*      Signature:   Julian Hy, DO 07/14/22 4:15 PM [facility]  For contact information, see Amion. If no  response to pager, please call PCCM consult pager. After hours, 7PM- 7AM, please call Elink.

## 2022-07-15 ENCOUNTER — Inpatient Hospital Stay (HOSPITAL_COMMUNITY): Payer: BC Managed Care – PPO

## 2022-07-15 DIAGNOSIS — J9819 Other pulmonary collapse: Secondary | ICD-10-CM

## 2022-07-15 DIAGNOSIS — J189 Pneumonia, unspecified organism: Secondary | ICD-10-CM | POA: Diagnosis not present

## 2022-07-15 DIAGNOSIS — J948 Other specified pleural conditions: Secondary | ICD-10-CM | POA: Diagnosis not present

## 2022-07-15 DIAGNOSIS — Z4682 Encounter for fitting and adjustment of non-vascular catheter: Secondary | ICD-10-CM | POA: Diagnosis not present

## 2022-07-15 LAB — CBC WITH DIFFERENTIAL/PLATELET
Abs Immature Granulocytes: 0.05 10*3/uL (ref 0.00–0.07)
Basophils Absolute: 0.1 10*3/uL (ref 0.0–0.1)
Basophils Relative: 1 %
Eosinophils Absolute: 0.1 10*3/uL (ref 0.0–0.5)
Eosinophils Relative: 1 %
HCT: 40.1 % (ref 39.0–52.0)
Hemoglobin: 13.2 g/dL (ref 13.0–17.0)
Immature Granulocytes: 1 %
Lymphocytes Relative: 19 %
Lymphs Abs: 1.9 10*3/uL (ref 0.7–4.0)
MCH: 33.8 pg (ref 26.0–34.0)
MCHC: 32.9 g/dL (ref 30.0–36.0)
MCV: 102.6 fL — ABNORMAL HIGH (ref 80.0–100.0)
Monocytes Absolute: 0.9 10*3/uL (ref 0.1–1.0)
Monocytes Relative: 9 %
Neutro Abs: 6.9 10*3/uL (ref 1.7–7.7)
Neutrophils Relative %: 69 %
Platelets: 569 10*3/uL — ABNORMAL HIGH (ref 150–400)
RBC: 3.91 MIL/uL — ABNORMAL LOW (ref 4.22–5.81)
RDW: 13.2 % (ref 11.5–15.5)
WBC: 9.9 10*3/uL (ref 4.0–10.5)
nRBC: 0 % (ref 0.0–0.2)

## 2022-07-15 LAB — CULTURE, BLOOD (ROUTINE X 2)
Culture: NO GROWTH
Culture: NO GROWTH

## 2022-07-15 LAB — BASIC METABOLIC PANEL
Anion gap: 12 (ref 5–15)
BUN: 8 mg/dL (ref 6–20)
CO2: 23 mmol/L (ref 22–32)
Calcium: 8.5 mg/dL — ABNORMAL LOW (ref 8.9–10.3)
Chloride: 99 mmol/L (ref 98–111)
Creatinine, Ser: 0.78 mg/dL (ref 0.61–1.24)
GFR, Estimated: >60 mL/min (ref >60)
Glucose, Bld: 105 mg/dL — ABNORMAL HIGH (ref 70–99)
Potassium: 3.6 mmol/L (ref 3.5–5.1)
Sodium: 134 mmol/L — ABNORMAL LOW (ref 135–145)

## 2022-07-15 LAB — GLUCOSE, CAPILLARY
Glucose-Capillary: 112 mg/dL — ABNORMAL HIGH (ref 70–99)
Glucose-Capillary: 110 mg/dL — ABNORMAL HIGH (ref 70–99)
Glucose-Capillary: 120 mg/dL — ABNORMAL HIGH (ref 70–99)
Glucose-Capillary: 91 mg/dL (ref 70–99)

## 2022-07-15 LAB — TSH: TSH: 1.294 u[IU]/mL (ref 0.350–4.500)

## 2022-07-15 LAB — VITAMIN B12: Vitamin B-12: 421 pg/mL (ref 180–914)

## 2022-07-15 MED ORDER — IOHEXOL 300 MG/ML  SOLN
75.0000 mL | Freq: Once | INTRAMUSCULAR | Status: AC | PRN
  Administered 2022-07-15: 75 mL via INTRAVENOUS

## 2022-07-15 NOTE — Progress Notes (Signed)
PROGRESS NOTE    Wayne Craig  JKD:[id number] DOB: Dec 18, 1975 DOA: 07/10/2022 PCP: Patient, No Pcp Per     Brief Narrative:  no significant past medical history who apparently had RSV infection earlier this month,  He was diagnosed with pneumonia 10 days ago and was given oral antibiotics (Augmentin and doxycycline) without significant improvement.  He presented to the ED due to epigastric abdominal pain, pleuritic chest pain. dyspnea, cough   CT chest with contrast on presentation showed : . Large left pleural effusion with collapse of the left lower lobe. Residual segmental aeration in the left upper lobe. 2. Heterogeneous enhancement and locules of air within the lower lobe suggesting pulmonary necrosis. 3. Cavitary lesions in the medial aspect of the right lower lobe compatible with cavitary pneumonia. Atypical agents and fungal infection should be considered. Septic emboli are also considered.  Subjective:  WBC normalized  no fever, on room air, no hypoxia, no edema, denies sob  Chest tube output 440cc last 24hrs  He is returned from CT chest  Assessment & Plan:  Principal Problem:   HCAP (healthcare-associated pneumonia) Active Problems:   Moderate protein malnutrition (HCC)   Hyperbilirubinemia   Macrocytosis   Pleural effusion on left   Hypomagnesemia   Need for management of chest tube   Aspiration pneumonia of left lower lobe (HCC)   Parapneumonic effusion    Assessment and Plan:    Cavitary pneumonia on the right Large left pleural effusion with left lower lobe collapse Sepsis presents on admission with hr>90, leukocytosis, pneumonia -Seen by pulmonology, suspect silent aspiration from poor dentition -Status post chest tube placement, cytology negative, pleural fluid culture negative, blood culture negative, wbc normalized -Has been on Zosyn since admission, antibiotic duration and chest tube management per pulmonology    Newly diagnosed diabetes,  with hyperglycemia A1c 7.2% Blood glucose on presentation was 219 Started on SSI since admission A.m. fasting blood glucose ranging from 10 9-1 34 Start carb modified diet, diabetes education Plan to discharge on metformin Follow-up with PCP  Hypokalemia/hypomagnesemia Replace K and mag  Thrombocytosis, mild Likely reactive Monitor  Macrocytosis Tsh wnl,  B12 421,   does reports alcohol use Monitor   Tobacco use Smoking cessation education provided Alcohol use, does not appear to have any issues with alcohol withdrawal  Poor dentition, recommend outpatient dental care      I have Reviewed nursing notes, Vitals, pain scores, I/o's, Lab results and  imaging results since pt's last encounter, details please see discussion above  I ordered the following labs:  Unresulted Labs (From admission, onward)     Start     Ordered   07/17/22 0500  Creatinine, serum  (enoxaparin (LOVENOX)    CrCl >/= 30 ml/min)  Weekly,   R     Comments: while on enoxaparin therapy    07/10/22 1131   07/15/22 0998  Basic metabolic panel  Daily,   R     Question:  Specimen collection method  Answer:  Lab=Lab collect   07/14/22 1846   07/11/22 0500  Creatinine, serum  Daily,   R      07/10/22 1146             DVT prophylaxis: enoxaparin (LOVENOX) injection 40 mg Start: 07/10/22 2200   Code Status:   Code Status: Full Code  Family Communication: Patient Disposition:   Dispo: The patient is from: Home              Anticipated d/c is  to: Home              Anticipated d/c date is: Once cleared by pulmonology  Antimicrobials:   Anti-infectives (From admission, onward)    Start     Dose/Rate Route Frequency Ordered Stop   07/10/22 2200  vancomycin (VANCOREADY) IVPB 1500 mg/300 mL  Status:  Discontinued        1,500 mg 150 mL/hr over 120 Minutes Intravenous Every 12 hours 07/10/22 1145 07/11/22 1032   07/10/22 1800  piperacillin-tazobactam (ZOSYN) IVPB 3.375 g        3.375 g 12.5  mL/hr over 240 Minutes Intravenous Every 8 hours 07/10/22 1133     07/10/22 1400  piperacillin-tazobactam (ZOSYN) IVPB 3.375 g  Status:  Discontinued        3.375 g 100 mL/hr over 30 Minutes Intravenous Every 8 hours 07/10/22 1122 07/10/22 1133   07/10/22 1000  vancomycin (VANCOREADY) IVPB 1500 mg/300 mL        1,500 mg 150 mL/hr over 120 Minutes Intravenous  Once 07/10/22 0955 07/10/22 1347   07/10/22 1000  ceFEPIme (MAXIPIME) 2 g in sodium chloride 0.9 % 100 mL IVPB        2 g 200 mL/hr over 30 Minutes Intravenous  Once 07/10/22 0955 07/10/22 1139           Objective: Vitals:   07/14/22 0439 07/14/22 1333 07/14/22 2011 07/15/22 0419  BP: 115/73 134/78 (!) 123/91 127/76  Pulse: 80 83 (!) 102 (!) 44  Resp: 16 18 16 18   Temp: 98.9 F (37.2 C) 97.6 F (36.4 C) 98.3 F (36.8 C) 98.4 F (36.9 C)  TempSrc:  Oral Oral Oral  SpO2: 99% 100% 93% 94%  Weight:      Height:        Intake/Output Summary (Last 24 hours) at 07/15/2022 0855 Last data filed at 07/15/2022 0600 Gross per 24 hour  Intake 1350 ml  Output 2790 ml  Net -1440 ml   Filed Weights   07/10/22 1111 07/10/22 1714  Weight: 88.5 kg 82.1 kg    Examination:  General exam: alert, awake, communicative,calm, NAD Respiratory system: left side chest tube, Respiratory effort normal. Cardiovascular system:  RRR.  Gastrointestinal system: Abdomen is nondistended, soft and nontender.  Normal bowel sounds heard. Central nervous system: Alert and oriented. No focal neurological deficits. Extremities:  no edema Skin: No rashes, lesions or ulcers Psychiatry: Judgement and insight appear normal. Mood & affect appropriate.     Data Reviewed: I have personally reviewed  labs and visualized  imaging studies since the last encounter and formulate the plan        Scheduled Meds:  enoxaparin (LOVENOX) injection  40 mg Subcutaneous Q24H   insulin aspart  0-9 Units Subcutaneous TID WC   magnesium oxide  400 mg Oral Daily    pneumococcal 20-valent conjugate vaccine  0.5 mL Intramuscular Tomorrow-1000   sodium chloride flush  10 mL Intrapleural Q8H   Continuous Infusions:  piperacillin-tazobactam (ZOSYN)  IV 3.375 g (07/15/22 0138)     LOS: 5 days   Time spent:  70mins  Florencia Reasons, MD PhD FACP [facility]  Available via Epic secure chat 7am-7pm for nonurgent issues Please page for urgent issues To page the attending provider between 7A-7P or the covering provider during after hours 7P-7A, please log into the web site [email or web address] and access using universal [place] password for that web site. If you do not have the password, please call the hospital operator.  07/15/2022, 8:55 AM

## 2022-07-15 NOTE — Progress Notes (Signed)
NAME:  Wayne Craig, MRN:  [id number], DOB:  April 27, 1976, LOS: 5 ADMISSION DATE:  07/10/2022, CONSULTATION DATE:  07/10/2022 REFERRING MD:  Dr. Olevia Bowens, [facility], CHIEF COMPLAINT:  Shortness of breath   History of Present Illness:  47 yo male smoker was seen in the ER on 06/30/22 with fever and cough after being presumptive diagnosis at an urgent care center with RSV.  Chest xray on 06/30/22 showed Lt lower lung field infiltrate and effuson, and he was discharged home with doxycycline and augmentin with plan for follow up with his PCP.  He presented to [facility] ER on 07/10/22 with abdominal pain and reports he recently finished therapy for pneumonia, but wasn't feeling improvement.  CT chest showed a very large left pleural effusion with compressive atelectasis of most of the Lt lung, and cavitary lesions in RLL.  Pertinent  Medical History  None  Significant Hospital Events: Including procedures, antibiotic start and stop dates in addition to other pertinent events   1/27 Admit, pigtail catheter inserted 1/29 first dose of tPA 1/30 2nd dose of TPA/dnase  Interim History / Subjective:  Has pain around chest tube when trying to sleep, otherwise denies significant complaints.   Objective   Blood pressure 127/76, pulse (!) 44, temperature 98.4 F (36.9 C), temperature source Oral, resp. rate 18, height 6' (1.829 m), weight 82.1 kg, SpO2 94 %.        Intake/Output Summary (Last 24 hours) at 07/15/2022 1937 Last data filed at 07/15/2022 0600 Gross per 24 hour  Intake 1350 ml  Output 2790 ml  Net -1440 ml    Filed Weights   07/10/22 1111 07/10/22 1714  Weight: 88.5 kg 82.1 kg    Examination:  General -middle aged man sitting up in bed in NAD HEENT:  [place]/AT, eyes anicteric Cardiac -S1S2, RRR Chest -reduced left breath sounds, CTA on the R. Breathing comfortably on RA.  Abdomen - nondistended Extremities - no c/c/e Skin warm, dry, no erythema around chest tube Neuro -awake, alert, moving all  extremities  Chest tube output: 440cc ([place] at 1040cc this afternoon)  Left pleural fluid 1/27 >> glucose 81, protein 5, LDH 4713, 97% neutrophils Pleural fluid culture: No growth, final Blood cultures : No growth to date CXR personally reviewed>residual small inferior infusion, resolving opacity in left lung, pigtail remains in place  Ct chest: hydropneumothorax, necrotic lung opacities on the left. Minimal residual pleural fluid, but significant pleural thickening.  Resolved Hospital Problem list     Assessment & Plan:   Cavitary pneumonia with very large left exudate parapneumonic pleural effusion; most of the effusion is evacuated, but now he has a hydropneumothorax and entrapped lung. - likely from silent aspiration in setting of poor dentition.    -Complete 7 days of appropriate antibiotics -Discussed case with TCTS--recommend consideration for VATS later once the lung abscesses have healed some; at this point the lung would be too fragile for decortication, but he may be a good candiate for this in a few weeks.  -remove chest tube when <200cc drainage per day; unfortunately TCTS feels like he may reaccumulate with ongoing pleural inflammation -con't chest tube to suction, ok for water seal to facilitate mobility -Needs to follow-up with a dentist after discharge  Tobacco abuse. -Recommend smoking cessation  Rest of care per primary team.    Labs       Latest Ref Rng & Units 07/15/2022    3:53 AM 07/14/2022    4:29 AM 07/13/2022    4:22  AM  CMP  Glucose 70 - 99 mg/dL 105  111  129   BUN 6 - 20 mg/dL 8  8  8    Creatinine 0.61 - 1.24 mg/dL 0.78  0.87  0.86   Sodium 135 - 145 mmol/L 134  134  131   Potassium 3.5 - 5.1 mmol/L 3.6  3.4  3.6   Chloride 98 - 111 mmol/L 99  97  96   CO2 22 - 32 mmol/L 23  25  23    Calcium 8.9 - 10.3 mg/dL 8.5  8.5  8.2        Latest Ref Rng & Units 07/15/2022    3:53 AM 07/13/2022    4:22 AM 07/12/2022    3:47 AM  CBC  WBC 4.0 - 10.5  K/uL 9.9  12.6  16.5   Hemoglobin 13.0 - 17.0 g/dL 13.2  13.8  13.8   Hematocrit 39.0 - 52.0 % 40.1  41.5  40.7   Platelets 150 - 400 K/uL 569  567  546     CBG (last 3)  Recent Labs    07/14/22 1109 07/14/22 1730 07/14/22 2008  GLUCAP 191* 132* 168*      Signature:   Julian Hy, DO 07/15/22 2:37 PM [facility]  For contact information, see Amion. If no response to pager, please call PCCM consult pager. After hours, 7PM- 7AM, please call Elink.

## 2022-07-16 DIAGNOSIS — J869 Pyothorax without fistula: Secondary | ICD-10-CM

## 2022-07-16 DIAGNOSIS — J189 Pneumonia, unspecified organism: Secondary | ICD-10-CM | POA: Diagnosis not present

## 2022-07-16 DIAGNOSIS — J851 Abscess of lung with pneumonia: Secondary | ICD-10-CM

## 2022-07-16 LAB — MAGNESIUM: Magnesium: 1.7 mg/dL (ref 1.7–2.4)

## 2022-07-16 LAB — BASIC METABOLIC PANEL
Anion gap: 14 (ref 5–15)
BUN: 8 mg/dL (ref 6–20)
CO2: 24 mmol/L (ref 22–32)
Calcium: 8.6 mg/dL — ABNORMAL LOW (ref 8.9–10.3)
Chloride: 98 mmol/L (ref 98–111)
Creatinine, Ser: 0.96 mg/dL (ref 0.61–1.24)
GFR, Estimated: >60 mL/min (ref >60)
Glucose, Bld: 101 mg/dL — ABNORMAL HIGH (ref 70–99)
Potassium: 3.7 mmol/L (ref 3.5–5.1)
Sodium: 136 mmol/L (ref 135–145)

## 2022-07-16 LAB — CBC WITH DIFFERENTIAL/PLATELET
Abs Immature Granulocytes: 0.07 10*3/uL (ref 0.00–0.07)
Basophils Absolute: 0.1 10*3/uL (ref 0.0–0.1)
Basophils Relative: 1 %
Eosinophils Absolute: 0.1 10*3/uL (ref 0.0–0.5)
Eosinophils Relative: 1 %
HCT: 38.4 % — ABNORMAL LOW (ref 39.0–52.0)
Hemoglobin: 12.6 g/dL — ABNORMAL LOW (ref 13.0–17.0)
Immature Granulocytes: 1 %
Lymphocytes Relative: 14 %
Lymphs Abs: 1.5 10*3/uL (ref 0.7–4.0)
MCH: 34.1 pg — ABNORMAL HIGH (ref 26.0–34.0)
MCHC: 32.8 g/dL (ref 30.0–36.0)
MCV: 104.1 fL — ABNORMAL HIGH (ref 80.0–100.0)
Monocytes Absolute: 0.9 10*3/uL (ref 0.1–1.0)
Monocytes Relative: 9 %
Neutro Abs: 7.8 10*3/uL — ABNORMAL HIGH (ref 1.7–7.7)
Neutrophils Relative %: 74 %
Platelets: 553 10*3/uL — ABNORMAL HIGH (ref 150–400)
RBC: 3.69 MIL/uL — ABNORMAL LOW (ref 4.22–5.81)
RDW: 12.9 % (ref 11.5–15.5)
WBC: 10.5 10*3/uL (ref 4.0–10.5)
nRBC: 0 % (ref 0.0–0.2)

## 2022-07-16 LAB — GLUCOSE, CAPILLARY
Glucose-Capillary: 99 mg/dL (ref 70–99)
Glucose-Capillary: 86 mg/dL (ref 70–99)
Glucose-Capillary: 113 mg/dL — ABNORMAL HIGH (ref 70–99)
Glucose-Capillary: 153 mg/dL — ABNORMAL HIGH (ref 70–99)

## 2022-07-16 LAB — PHOSPHORUS: Phosphorus: 3.5 mg/dL (ref 2.5–4.6)

## 2022-07-16 NOTE — Progress Notes (Signed)
NAME:  Wayne Craig, MRN:  [id number], DOB:  [birth year], LOS: 6 ADMISSION DATE:  07/10/2022, CONSULTATION DATE:  07/10/2022 REFERRING MD:  Dr. Olevia Bowens, [facility], CHIEF COMPLAINT:  Shortness of breath   History of Present Illness:  47 yo male smoker was seen in the ER on 06/30/22 with fever and cough after being presumptive diagnosis at an urgent care center with RSV.  Chest xray on 06/30/22 showed Lt lower lung field infiltrate and effuson, and he was discharged home with doxycycline and augmentin with plan for follow up with his PCP.  He presented to [facility] ER on 07/10/22 with abdominal pain and reports he recently finished therapy for pneumonia, but wasn't feeling improvement.  CT chest showed a very large left pleural effusion with compressive atelectasis of most of the Lt lung, and cavitary lesions in RLL.  Pertinent  Medical History  None  Significant Hospital Events: Including procedures, antibiotic start and stop dates in addition to other pertinent events   1/27 Admit, pigtail catheter inserted 1/29 first dose of tPA 1/30 2nd dose of TPA/dnase  Interim History / Subjective:  Still has some soreness around CT and pleuritic pain when coughing in LLL.  Coughing up clear and sometimes greenish sputum, otherwise no complaints.   Objective   Blood pressure 119/78, pulse 82, temperature 98 F (36.7 C), temperature source Oral, resp. rate 18, height 6' (1.829 m), weight 82.1 kg, SpO2 94 %.        Intake/Output Summary (Last 24 hours) at 07/16/2022 1010 Last data filed at 07/16/2022 0856 Gross per 24 hour  Intake 903.96 ml  Output 1645 ml  Net -741.04 ml   Filed Weights   07/10/22 1111 07/10/22 1714  Weight: 88.5 kg 82.1 kg    Examination: General:  Adult male sitting upright in bed in NAD HEENT: MM pink/moist Neuro: AO, MAE CV: rr PULM:  non labored, clear, diminished LLL, L pigtail CT to 20sxn, no airleak, serous pleural fluid with tissue  Extremities: warm/dry, no LE edema   Chest  tube output: 170 ml/ 24hrs plus additional 70 on my exam  Left pleural fluid 1/27 >> glucose 81, protein 5, LDH 4713, 97% neutrophils Pleural fluid culture: No growth, final Blood cultures : negative  No imaging today  Resolved Hospital Problem list     Assessment & Plan:   Cavitary pneumonia with very large left exudate parapneumonic pleural effusion; most of the effusion is evacuated, but now he has a hydropneumothorax and entrapped lung. - likely from silent aspiration in setting of poor dentition.    - completes 7 days of zosyn today.  Will need to start on prolonged course of augmentin, f/b repeat imaging - case with TCTS 2/1--recommend consideration for VATS later once the lung abscesses have healed some; at this point the lung would be too fragile for decortication, but he may be a good candiate for this in a few weeks.  - goal  <200cc drainage per day for removal.  Today he is still right at that.  Will discuss with Dr. Elsworth Soho leaving additional 24hrs vs removing.  Unfortunately TCTS feels like he may reaccumulate with ongoing pleural inflammation. - con't chest tube to suction, ok for water seal to facilitate mobility - dental outpt f/u Needs to follow-up with a dentist after discharge  Tobacco abuse. - smoking cessation counseling  Rest of care per primary team.    Labs       Latest Ref Rng & Units 07/16/2022    3:27  AM 07/15/2022    3:53 AM 07/14/2022    4:29 AM  CMP  Glucose 70 - 99 mg/dL 101  105  111   BUN 6 - 20 mg/dL 8  8  8    Creatinine 0.61 - 1.24 mg/dL 0.96  0.78  0.87   Sodium 135 - 145 mmol/L 136  134  134   Potassium 3.5 - 5.1 mmol/L 3.7  3.6  3.4   Chloride 98 - 111 mmol/L 98  99  97   CO2 22 - 32 mmol/L 24  23  25    Calcium 8.9 - 10.3 mg/dL 8.6  8.5  8.5        Latest Ref Rng & Units 07/16/2022    3:27 AM 07/15/2022    3:53 AM 07/13/2022    4:22 AM  CBC  WBC 4.0 - 10.5 K/uL 10.5  9.9  12.6   Hemoglobin 13.0 - 17.0 g/dL 12.6  13.2  13.8   Hematocrit 39.0  - 52.0 % 38.4  40.1  41.5   Platelets 150 - 400 K/uL 553  569  567     CBG (last 3)  Recent Labs    07/15/22 1625 07/15/22 2108 07/16/22 0752  GLUCAP 120* 91 99     Signature:     Amanda Cockayne [facility] 07/16/2022, 10:10 AM  See Amion for pager If no response to pager, please call PCCM consult pager After 7:00 pm call Elink

## 2022-07-16 NOTE — Progress Notes (Signed)
PROGRESS NOTE    Wayne Craig  ZTI:[id number] DOB: 1975/08/29 DOA: 07/10/2022 PCP: Patient, No Pcp Per     Brief Narrative:  no significant past medical history who apparently had RSV infection earlier this month,  He was diagnosed with pneumonia 10 days ago and was given oral antibiotics (Augmentin and doxycycline) without significant improvement.  He presented to the ED due to epigastric abdominal pain, pleuritic chest pain. dyspnea, cough   CT chest with contrast on presentation showed : . Large left pleural effusion with collapse of the left lower lobe. Residual segmental aeration in the left upper lobe. 2. Heterogeneous enhancement and locules of air within the lower lobe suggesting pulmonary necrosis. 3. Cavitary lesions in the medial aspect of the right lower lobe compatible with cavitary pneumonia. Atypical agents and fungal infection should be considered. Septic emboli are also considered.  Subjective:  No acute interval changes WBC normalized  no fever, on room air, no hypoxia, no edema, denies sob  Chest tube output 170cc last 24hrs    Assessment & Plan:  Principal Problem:   HCAP (healthcare-associated pneumonia) Active Problems:   Moderate protein malnutrition (HCC)   Hyperbilirubinemia   Macrocytosis   Pleural effusion on left   Hypomagnesemia   Need for management of chest tube   Aspiration pneumonia of left lower lobe (HCC)   Parapneumonic effusion   Trapped lung   Hydropneumothorax    Assessment and Plan:    Cavitary pneumonia on the right Large left pleural effusion with left lower lobe collapse Sepsis presents on admission with hr>90, leukocytosis, pneumonia -Seen by pulmonology, suspect silent aspiration from poor dentition -Status post chest tube placement, cytology negative, pleural fluid culture negative, blood culture negative, wbc normalized -Has been on Zosyn since admission, antibiotic duration and chest tube management per  pulmonology    Newly diagnosed diabetes, with hyperglycemia A1c 7.2% Blood glucose on presentation was 219 Started on SSI since admission A.m. fasting blood glucose ranging from 10 9-1 34 Start carb modified diet, diabetes education Plan to discharge on metformin Follow-up with PCP  Hypokalemia/hypomagnesemia Replace K and mag  Thrombocytosis, mild Likely reactive Monitor  Macrocytosis Tsh wnl,  B12 421,   does reports alcohol use Monitor   Tobacco use Smoking cessation education provided Alcohol use, does not appear to have any issues with alcohol withdrawal  Poor dentition, recommend outpatient dental care      I have Reviewed nursing notes, Vitals, pain scores, I/o's, Lab results and  imaging results since pt's last encounter, details please see discussion above  I ordered the following labs:  Unresulted Labs (From admission, onward)     Start     Ordered   07/17/22 0500  Creatinine, serum  (enoxaparin (LOVENOX)    CrCl >/= 30 ml/min)  Weekly,   R     Comments: while on enoxaparin therapy    07/10/22 1131   07/15/22 8250  Basic metabolic panel  Daily,   R     Question:  Specimen collection method  Answer:  Lab=Lab collect   07/14/22 1846   07/11/22 0500  Creatinine, serum  Daily,   R      07/10/22 1146             DVT prophylaxis: enoxaparin (LOVENOX) injection 40 mg Start: 07/10/22 2200   Code Status:   Code Status: Full Code  Family Communication: Patient Disposition:   Dispo: The patient is from: Home  Anticipated d/c is to: Home              Anticipated d/c date is: not medically ready to discharge, need thoracic surgery input before discharge,  pulmonology is coordinating with thoracic surgery  Antimicrobials:   Anti-infectives (From admission, onward)    Start     Dose/Rate Route Frequency Ordered Stop   07/10/22 2200  vancomycin (VANCOREADY) IVPB 1500 mg/300 mL  Status:  Discontinued        1,500 mg 150 mL/hr over 120  Minutes Intravenous Every 12 hours 07/10/22 1145 07/11/22 1032   07/10/22 1800  piperacillin-tazobactam (ZOSYN) IVPB 3.375 g        3.375 g 12.5 mL/hr over 240 Minutes Intravenous Every 8 hours 07/10/22 1133 07/18/22 0159   07/10/22 1400  piperacillin-tazobactam (ZOSYN) IVPB 3.375 g  Status:  Discontinued        3.375 g 100 mL/hr over 30 Minutes Intravenous Every 8 hours 07/10/22 1122 07/10/22 1133   07/10/22 1000  vancomycin (VANCOREADY) IVPB 1500 mg/300 mL        1,500 mg 150 mL/hr over 120 Minutes Intravenous  Once 07/10/22 0955 07/10/22 1347   07/10/22 1000  ceFEPIme (MAXIPIME) 2 g in sodium chloride 0.9 % 100 mL IVPB        2 g 200 mL/hr over 30 Minutes Intravenous  Once 07/10/22 0955 07/10/22 1139           Objective: Vitals:   07/15/22 0419 07/15/22 1337 07/15/22 2110 07/16/22 0340  BP: 127/76 130/74 119/78   Pulse: (!) 44 94 82   Resp: 18 18 18 18   Temp: 98.4 F (36.9 C) 97.9 F (36.6 C) 98 F (36.7 C)   TempSrc: Oral Oral Oral   SpO2: 94% 95% 94%   Weight:      Height:        Intake/Output Summary (Last 24 hours) at 07/16/2022 1123 Last data filed at 07/16/2022 0856 Gross per 24 hour  Intake 903.96 ml  Output 1645 ml  Net -741.04 ml   Filed Weights   07/10/22 1111 07/10/22 1714  Weight: 88.5 kg 82.1 kg    Examination:  General exam: alert, awake, communicative,calm, NAD Respiratory system: left side chest tube, Respiratory effort normal. Cardiovascular system:  RRR.  Gastrointestinal system: Abdomen is nondistended, soft and nontender.  Normal bowel sounds heard. Central nervous system: Alert and oriented. No focal neurological deficits. Extremities:  no edema Skin: No rashes, lesions or ulcers Psychiatry: Judgement and insight appear normal. Mood & affect appropriate.     Data Reviewed: I have personally reviewed  labs and visualized  imaging studies since the last encounter and formulate the plan        Scheduled Meds:  enoxaparin (LOVENOX)  injection  40 mg Subcutaneous Q24H   insulin aspart  0-9 Units Subcutaneous TID WC   pneumococcal 20-valent conjugate vaccine  0.5 mL Intramuscular Tomorrow-1000   sodium chloride flush  10 mL Intrapleural Q8H   Continuous Infusions:  piperacillin-tazobactam (ZOSYN)  IV 3.375 g (07/16/22 0903)     LOS: 6 days   Time spent:  41mins  Wayne Reasons, MD PhD FACP [facility]  Available via Epic secure chat 7am-7pm for nonurgent issues Please page for urgent issues To page the attending provider between 7A-7P or the covering provider during after hours 7P-7A, please log into the web site [email or web address] and access using universal [place] password for that web site. If you do not have the password, please call  the hospital operator.    07/16/2022, 11:23 AM

## 2022-07-16 NOTE — Plan of Care (Signed)
  Problem: Activity: Goal: Ability to tolerate increased activity will improve Outcome: Progressing   Problem: Respiratory: Goal: Ability to maintain adequate ventilation will improve Outcome: Progressing   Problem: Education: Goal: Knowledge of General Education information will improve Description: Including pain rating scale, medication(s)/side effects and non-pharmacologic comfort measures Outcome: Completed/Met   

## 2022-07-16 NOTE — Plan of Care (Signed)
  Problem: Activity: Goal: Ability to tolerate increased activity will improve 07/16/2022 1319 by Margarette Canada, RN Outcome: Progressing 07/16/2022 1313 by Margarette Canada, RN Outcome: Progressing   Problem: Respiratory: Goal: Ability to maintain adequate ventilation will improve Outcome: Progressing   Problem: Education: Goal: Knowledge of General Education information will improve Description: Including pain rating scale, medication(s)/side effects and non-pharmacologic comfort measures Outcome: Completed/Met   Problem: Activity: Goal: Risk for activity intolerance will decrease Outcome: Progressing

## 2022-07-17 ENCOUNTER — Inpatient Hospital Stay (HOSPITAL_COMMUNITY): Payer: BC Managed Care – PPO

## 2022-07-17 DIAGNOSIS — G062 Extradural and subdural abscess, unspecified: Secondary | ICD-10-CM

## 2022-07-17 DIAGNOSIS — J851 Abscess of lung with pneumonia: Secondary | ICD-10-CM | POA: Diagnosis not present

## 2022-07-17 DIAGNOSIS — J189 Pneumonia, unspecified organism: Secondary | ICD-10-CM | POA: Diagnosis not present

## 2022-07-17 DIAGNOSIS — J869 Pyothorax without fistula: Secondary | ICD-10-CM | POA: Diagnosis not present

## 2022-07-17 LAB — BASIC METABOLIC PANEL WITH GFR
Anion gap: 7 (ref 5–15)
BUN: 8 mg/dL (ref 6–20)
CO2: 28 mmol/L (ref 22–32)
Calcium: 8.9 mg/dL (ref 8.9–10.3)
Chloride: 103 mmol/L (ref 98–111)
Creatinine, Ser: 0.92 mg/dL (ref 0.61–1.24)
GFR, Estimated: >60 mL/min (ref >60)
Glucose, Bld: 110 mg/dL — ABNORMAL HIGH (ref 70–99)
Potassium: 4 mmol/L (ref 3.5–5.1)
Sodium: 138 mmol/L (ref 135–145)

## 2022-07-17 LAB — GLUCOSE, CAPILLARY
Glucose-Capillary: 119 mg/dL — ABNORMAL HIGH (ref 70–99)
Glucose-Capillary: 129 mg/dL — ABNORMAL HIGH (ref 70–99)
Glucose-Capillary: 104 mg/dL — ABNORMAL HIGH (ref 70–99)
Glucose-Capillary: 92 mg/dL (ref 70–99)

## 2022-07-17 MED ORDER — LIVING WELL WITH DIABETES BOOK
Freq: Once | Status: DC
  Filled 2022-07-17: qty 1

## 2022-07-17 NOTE — Plan of Care (Signed)
  Problem: Activity: Goal: Ability to tolerate increased activity will improve Outcome: Progressing   Problem: Clinical Measurements: Goal: Ability to maintain a body temperature in the normal range will improve Outcome: Progressing   Problem: Respiratory: Goal: Ability to maintain adequate ventilation will improve Outcome: Progressing Goal: Ability to maintain a clear airway will improve Outcome: Progressing   Problem: Health Behavior/Discharge Planning: Goal: Ability to manage health-related needs will improve Outcome: Progressing   Problem: Clinical Measurements: Goal: Ability to maintain clinical measurements within normal limits will improve Outcome: Progressing Goal: Will remain free from infection Outcome: Progressing Goal: Diagnostic test results will improve Outcome: Progressing Goal: Respiratory complications will improve Outcome: Progressing Goal: Cardiovascular complication will be avoided Outcome: Progressing   Problem: Activity: Goal: Risk for activity intolerance will decrease Outcome: Progressing   Problem: Nutrition: Goal: Adequate nutrition will be maintained Outcome: Progressing   Problem: Coping: Goal: Level of anxiety will decrease Outcome: Progressing   Problem: Elimination: Goal: Will not experience complications related to bowel motility Outcome: Progressing Goal: Will not experience complications related to urinary retention Outcome: Progressing   Problem: Pain Managment: Goal: General experience of comfort will improve Outcome: Progressing   Problem: Safety: Goal: Ability to remain free from injury will improve Outcome: Progressing   Problem: Skin Integrity: Goal: Risk for impaired skin integrity will decrease Outcome: Progressing   Problem: Education: Goal: Ability to describe self-care measures that may prevent or decrease complications (Diabetes Survival Skills Education) will improve Outcome: Progressing Goal: Individualized  Educational Video(s) Outcome: Progressing   Problem: Coping: Goal: Ability to adjust to condition or change in health will improve Outcome: Progressing   Problem: Fluid Volume: Goal: Ability to maintain a balanced intake and output will improve Outcome: Progressing   Problem: Health Behavior/Discharge Planning: Goal: Ability to identify and utilize available resources and services will improve Outcome: Progressing Goal: Ability to manage health-related needs will improve Outcome: Progressing   Problem: Metabolic: Goal: Ability to maintain appropriate glucose levels will improve Outcome: Progressing   Problem: Nutritional: Goal: Maintenance of adequate nutrition will improve Outcome: Progressing Goal: Progress toward achieving an optimal weight will improve Outcome: Progressing   Problem: Skin Integrity: Goal: Risk for impaired skin integrity will decrease Outcome: Progressing   Problem: Tissue Perfusion: Goal: Adequacy of tissue perfusion will improve Outcome: Progressing

## 2022-07-17 NOTE — Progress Notes (Signed)
PROGRESS NOTE    Wayne Craig  BTD:[id number] DOB: [birth year] DOA: 07/10/2022 PCP: Patient, No Pcp Per     Brief Narrative:  no significant past medical history who apparently had RSV infection earlier this month,  He was diagnosed with pneumonia 10 days ago and was given oral antibiotics (Augmentin and doxycycline) without significant improvement.  He presented to the ED due to epigastric abdominal pain, pleuritic chest pain. dyspnea, cough   CT chest with contrast on presentation showed : . Large left pleural effusion with collapse of the left lower lobe. Residual segmental aeration in the left upper lobe. 2. Heterogeneous enhancement and locules of air within the lower lobe suggesting pulmonary necrosis. 3. Cavitary lesions in the medial aspect of the right lower lobe compatible with cavitary pneumonia. Atypical agents and fungal infection should be considered. Septic emboli are also considered.  Subjective:  No acute interval changes WBC normalized  no fever, on room air, no hypoxia, no edema, denies sob  Chest tube output 100cc last 24hrs  Family at bedside  Assessment & Plan:  Principal Problem:   HCAP (healthcare-associated pneumonia) Active Problems:   Moderate protein malnutrition (HCC)   Hyperbilirubinemia   Macrocytosis   Pleural effusion on left   Hypomagnesemia   Need for management of chest tube   Aspiration pneumonia of left lower lobe (HCC)   Parapneumonic effusion   Trapped lung   Hydropneumothorax    Assessment and Plan:    Cavitary pneumonia on the right Large left pleural effusion with left lower lobe collapse Sepsis presents on admission with hr>90, leukocytosis, pneumonia -Seen by pulmonology, suspect silent aspiration from poor dentition -Status post chest tube placement, cytology negative, pleural fluid culture negative, blood culture negative, wbc normalized -Has been on Zosyn since admission, antibiotic duration and chest tube  management per pulmonology    Newly diagnosed diabetes, with hyperglycemia A1c 7.2% Blood glucose on presentation was 219 Started on SSI since admission A.m. fasting blood glucose ranging from 101-1 34 Start carb modified diet, diabetes education Plan to discharge on metformin Follow-up with PCP  Hypokalemia/hypomagnesemia Replace K and mag and improved  Thrombocytosis, mild Likely reactive Monitor  Macrocytosis Tsh wnl,  B12 421,   does reports alcohol use Monitor   Tobacco use Smoking cessation education provided Alcohol use, does not appear to have any issues with alcohol withdrawal  Poor dentition, recommend outpatient dental care      I have Reviewed nursing notes, Vitals, pain scores, I/o's, Lab results and  imaging results since pt's last encounter, details please see discussion above  I ordered the following labs:  Unresulted Labs (From admission, onward)     Start     Ordered   07/19/22 0500  CBC  Every Mon-Wed-Fri (0500),   R     Question:  Specimen collection method  Answer:  Lab=Lab collect   07/17/22 1616   07/19/22 1062  Basic metabolic panel  Every Mon-Wed-Fri (0500),   R     Question:  Specimen collection method  Answer:  Lab=Lab collect   07/17/22 1616   07/17/22 0500  Creatinine, serum  (enoxaparin (LOVENOX)    CrCl >/= 30 ml/min)  Weekly,   R     Comments: while on enoxaparin therapy    07/10/22 1131   07/11/22 0500  Creatinine, serum  Daily,   R      07/10/22 1146             DVT prophylaxis: enoxaparin (LOVENOX) injection 40 mg  Start: 07/10/22 2200   Code Status:   Code Status: Full Code  Family Communication: Patient Disposition:   Dispo: The patient is from: Home              Anticipated d/c is to: Home              Anticipated d/c date is: clinically improving, d/c tele, change to med surg bed, but not medically ready to discharge, need thoracic surgery input before discharge,  pulmonology is coordinating with thoracic  surgery  Antimicrobials:   Anti-infectives (From admission, onward)    Start     Dose/Rate Route Frequency Ordered Stop   07/10/22 2200  vancomycin (VANCOREADY) IVPB 1500 mg/300 mL  Status:  Discontinued        1,500 mg 150 mL/hr over 120 Minutes Intravenous Every 12 hours 07/10/22 1145 07/11/22 1032   07/10/22 1800  piperacillin-tazobactam (ZOSYN) IVPB 3.375 g        3.375 g 12.5 mL/hr over 240 Minutes Intravenous Every 8 hours 07/10/22 1133 07/18/22 0159   07/10/22 1400  piperacillin-tazobactam (ZOSYN) IVPB 3.375 g  Status:  Discontinued        3.375 g 100 mL/hr over 30 Minutes Intravenous Every 8 hours 07/10/22 1122 07/10/22 1133   07/10/22 1000  vancomycin (VANCOREADY) IVPB 1500 mg/300 mL        1,500 mg 150 mL/hr over 120 Minutes Intravenous  Once 07/10/22 0955 07/10/22 1347   07/10/22 1000  ceFEPIme (MAXIPIME) 2 g in sodium chloride 0.9 % 100 mL IVPB        2 g 200 mL/hr over 30 Minutes Intravenous  Once 07/10/22 0955 07/10/22 1139           Objective: Vitals:   07/16/22 1216 07/16/22 2101 07/17/22 0421 07/17/22 1331  BP: 126/74 122/75 124/78 137/83  Pulse: 82 87 79 79  Resp: 16 18 20 18   Temp: 97.7 F (36.5 C) 98 F (36.7 C) 98.2 F (36.8 C) 98.5 F (36.9 C)  TempSrc: Oral Oral Oral Oral  SpO2: 98% 96% 94% 98%  Weight:      Height:        Intake/Output Summary (Last 24 hours) at 07/17/2022 1617 Last data filed at 07/17/2022 1158 Gross per 24 hour  Intake 499.7 ml  Output 1970 ml  Net -1470.3 ml   Filed Weights   07/10/22 1111 07/10/22 1714  Weight: 88.5 kg 82.1 kg    Examination:  General exam: alert, awake, communicative,calm, NAD Respiratory system: left side chest tube, Respiratory effort normal. Cardiovascular system:  RRR.  Gastrointestinal system: Abdomen is nondistended, soft and nontender.  Normal bowel sounds heard. Central nervous system: Alert and oriented. No focal neurological deficits. Extremities:  no edema Skin: No rashes, lesions  or ulcers Psychiatry: Judgement and insight appear normal. Mood & affect appropriate.     Data Reviewed: I have personally reviewed  labs and visualized  imaging studies since the last encounter and formulate the plan        Scheduled Meds:  enoxaparin (LOVENOX) injection  40 mg Subcutaneous Q24H   insulin aspart  0-9 Units Subcutaneous TID WC   pneumococcal 20-valent conjugate vaccine  0.5 mL Intramuscular Tomorrow-1000   sodium chloride flush  10 mL Intrapleural Q8H   Continuous Infusions:  piperacillin-tazobactam (ZOSYN)  IV 3.375 g (07/17/22 1034)     LOS: 7 days   Time spent:  78mins  Florencia Reasons, MD PhD FACP [facility]  Available via Epic secure chat  7am-7pm for nonurgent issues Please page for urgent issues To page the attending provider between 7A-7P or the covering provider during after hours 7P-7A, please log into the web site [email or web address] and access using universal [place] password for that web site. If you do not have the password, please call the hospital operator.    07/17/2022, 4:17 PM

## 2022-07-17 NOTE — Inpatient Diabetes Management (Signed)
Inpatient Diabetes Program Recommendations  AACE/ADA: New Consensus Statement on Inpatient Glycemic Control (2015)  Target Ranges:  Prepandial:   less than 140 mg/dL      Peak postprandial:   less than 180 mg/dL (1-2 hours)      Critically ill patients:  140 - 180 mg/dL   Lab Results  Component Value Date   GLUCAP 104 (H) 07/17/2022   HGBA1C 7.2 (H) 07/10/2022    Review of Glycemic Control  Diabetes history: New-onset DM Outpatient Diabetes medications: None Current orders for Inpatient glycemic control: Novolog 0-9 units TID  HgbA1C - 7.2%  Inpatient Diabetes Program Recommendations:    Ordered Living Well with Diabetes book  Will see pt regarding new diagnosis on 3/5.  Thank you. Lorenda Peck, RD, LDN, [place] Inpatient Diabetes Coordinator [phone]

## 2022-07-17 NOTE — Progress Notes (Addendum)
NAME:  Wayne Craig, MRN:  [id number], DOB:  [birth year], LOS: 7 ADMISSION DATE:  07/10/2022, CONSULTATION DATE:  07/10/2022 REFERRING MD:  Dr. Olevia Bowens, [facility], CHIEF COMPLAINT:  Shortness of breath   History of Present Illness:  47 yo male smoker was seen in the ER on 06/30/22 with fever and cough after being presumptive diagnosis at an urgent care center with RSV.  Chest xray on 06/30/22 showed Lt lower lung field infiltrate and effuson, and he was discharged home with doxycycline and augmentin with plan for follow up with his PCP.  He presented to [facility] ER on 07/10/22 with abdominal pain and reports he recently finished therapy for pneumonia, but wasn't feeling improvement.  CT chest showed a very large left pleural effusion with compressive atelectasis of most of the Lt lung, and cavitary lesions in RLL.  Pertinent  Medical History  None  Significant Hospital Events: Including procedures, antibiotic start and stop dates in addition to other pertinent events   1/27 Admit, pigtail catheter inserted 1/29 first dose of tPA 1/30 2nd dose of TPA/dnase  Interim History / Subjective:   Complains of pain at chest tube site Afebrile  Objective   Blood pressure 124/78, pulse 79, temperature 98.2 F (36.8 C), temperature source Oral, resp. rate 20, height 6' (1.829 m), weight 82.1 kg, SpO2 94 %.        Intake/Output Summary (Last 24 hours) at 07/17/2022 1324 Last data filed at 07/17/2022 0700 Gross per 24 hour  Intake 549.7 ml  Output 1750 ml  Net -1200.3 ml    Filed Weights   07/10/22 1111 07/10/22 1714  Weight: 88.5 kg 82.1 kg    Examination: General:  Adult male sitting upright in bed in NAD HEENT: MM pink/moist Neuro: AO, MAE CV: S1-S2 regular, no murmur PULM: No accessory muscle use, decreased on left, L pigtail CT to 20sxn, no airleak, serous pleural fluid with tissue  Extremities: warm/dry, no LE edema   Chest tube output: 100 ml/ 24hrs   Left pleural fluid 1/27 >> glucose 81,  protein 5, LDH 4713, 97% neutrophils Pleural fluid culture: No growth, final Blood cultures : negative    Resolved Hospital Problem list     Assessment & Plan:   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 - likely from silent aspiration in setting of poor dentition.    -On Zosyn for 7 days will need prolonged course of augmentin, f/b repeat imaging - case with TCTS 2/1--recommend consideration for VATS later once the lung abscesses have healed some; at this point the lung would be too fragile for decortication, but he may be a good candiate for this in a few weeks.  -Obtain chest x-ray, if appears okay then will discontinue chest tube since drainage is decreased but will need close follow-up to see if fluid reaccumulate's and whether lung expands - dental outpt f/u Needs to follow-up with a dentist after discharge  Tobacco abuse. - smoking cessation counseling  Rest of care per primary team.    Labs       Latest Ref Rng & Units 07/17/2022    4:25 AM 07/16/2022    3:27 AM 07/15/2022    3:53 AM  CMP  Glucose 70 - 99 mg/dL 110  101  105   BUN 6 - 20 mg/dL 8  8  8    Creatinine 0.61 - 1.24  mg/dL 0.92  0.96  0.78   Sodium 135 - 145 mmol/L 138  136  134   Potassium 3.5 - 5.1 mmol/L 4.0  3.7  3.6   Chloride 98 - 111 mmol/L 103  98  99   CO2 22 - 32 mmol/L 28  24  23    Calcium 8.9 - 10.3 mg/dL 8.9  8.6  8.5        Latest Ref Rng & Units 07/16/2022    3:27 AM 07/15/2022    3:53 AM 07/13/2022    4:22 AM  CBC  WBC 4.0 - 10.5 K/uL 10.5  9.9  12.6   Hemoglobin 13.0 - 17.0 g/dL 12.6  13.2  13.8   Hematocrit 39.0 - 52.0 % 38.4  40.1  41.5   Platelets 150 - 400 K/uL 553  569  567     CBG (last 3)  Recent Labs    07/16/22 1722 07/16/22 2104 07/17/22 0750  GLUCAP 113* 153* 119*       Leana Springston V. Elsworth Soho MD [facility] 07/17/2022, 9:18 AM  See Amion for pager If no response to pager, please call PCCM consult pager After 7:00 pm call Elink

## 2022-07-18 DIAGNOSIS — J869 Pyothorax without fistula: Secondary | ICD-10-CM | POA: Diagnosis not present

## 2022-07-18 DIAGNOSIS — J851 Abscess of lung with pneumonia: Secondary | ICD-10-CM | POA: Diagnosis not present

## 2022-07-18 LAB — GLUCOSE, CAPILLARY
Glucose-Capillary: 98 mg/dL (ref 70–99)
Glucose-Capillary: 78 mg/dL (ref 70–99)
Glucose-Capillary: 101 mg/dL — ABNORMAL HIGH (ref 70–99)
Glucose-Capillary: 107 mg/dL — ABNORMAL HIGH (ref 70–99)

## 2022-07-18 LAB — CREATININE, SERUM
Creatinine, Ser: 0.93 mg/dL (ref 0.61–1.24)
GFR, Estimated: >60 mL/min (ref >60)

## 2022-07-18 MED ORDER — PIPERACILLIN-TAZOBACTAM 3.375 G IVPB
3.3750 g | Freq: Three times a day (TID) | INTRAVENOUS | Status: DC
  Administered 2022-07-18 – 2022-07-19 (×4): 3.375 g via INTRAVENOUS
  Filled 2022-07-18 (×5): qty 50

## 2022-07-18 NOTE — Progress Notes (Signed)
NAME:  Wayne Craig, MRN:  [id number], DOB:  [birth year], LOS: 8 ADMISSION DATE:  07/10/2022, CONSULTATION DATE:  07/10/2022 REFERRING MD:  Dr. Olevia Bowens, [facility], CHIEF COMPLAINT:  Shortness of breath   History of Present Illness:  47 yo male smoker was seen in the ER on 06/30/22 with fever and cough after being presumptive diagnosis at an urgent care center with RSV.  Chest xray on 06/30/22 showed Lt lower lung field infiltrate and effuson, and he was discharged home with doxycycline and augmentin with plan for follow up with his PCP.  He presented to [facility] ER on 07/10/22 with abdominal pain and reports he recently finished therapy for pneumonia, but wasn't feeling improvement.  CT chest showed a very large left pleural effusion with compressive atelectasis of most of the Lt lung, and cavitary lesions in RLL.  Pertinent  Medical History  None  Significant Hospital Events: Including procedures, antibiotic start and stop dates in addition to other pertinent events   1/27 Admit, pigtail catheter inserted 1/29 first dose of tPA 1/30 2nd dose of TPA/dnase 2/3 dc'd pigtail  Interim History / Subjective:   Complains of mild cough. Afebrile No dyspnea  Objective   Blood pressure 129/81, pulse 85, temperature 98.3 F (36.8 C), temperature source Oral, resp. rate 19, height 6' (1.829 m), weight 82.1 kg, SpO2 99 %.        Intake/Output Summary (Last 24 hours) at 07/18/2022 1005 Last data filed at 07/18/2022 0845 Gross per 24 hour  Intake 960 ml  Output 620 ml  Net 340 ml    Filed Weights   07/10/22 1111 07/10/22 1714  Weight: 88.5 kg 82.1 kg    Examination: General:  Adult male sitting upright in bed in NAD HEENT: MM pink/moist Neuro: AO, MAE CV: S1-S2 regular, no murmur PULM: Decreased breath sounds on left, no accessory muscle use Extremities: warm/dry, no LE edema    Left pleural fluid 1/27 >> glucose 81, protein 5, LDH 4713, 97% neutrophils Pleural fluid culture: No growth,  final Blood cultures : negative    Resolved Hospital Problem list     Assessment & Plan:   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 - likely from silent aspiration in setting of poor dentition.    -Continue Zosyn, will need prolonged course of augmentin, f/b repeat imaging - case with TCTS 2/1--recommend consideration for VATS later once the lung abscesses have healed some; at this point the lung would be too fragile for decortication, but he may be a good candiate for this in a few weeks.  -He has a thick pleural rind, check with TCTS on 2/5 and if they recommend conservative approach, then he can be discharged on Augmentin with close follow-up to see if effusion/empyema recurs - dental outpt f/u Needs to follow-up with a dentist after discharge  Tobacco abuse. - smoking cessation counseling  Rest of care per primary team.    Labs       Latest Ref Rng & Units 07/18/2022    3:31 AM 07/17/2022    4:25 AM 07/16/2022    3:27 AM  CMP  Glucose 70 - 99 mg/dL  110  101   BUN 6 - 20 mg/dL  8  8   Creatinine 0.61 - 1.24 mg/dL 0.93  0.92  0.96   Sodium 135 - 145 mmol/L  138  136   Potassium 3.5 - 5.1 mmol/L  4.0  3.7   Chloride 98 - 111 mmol/L  103  98   CO2 22 - 32 mmol/L  28  24   Calcium 8.9 - 10.3 mg/dL  8.9  8.6        Latest Ref Rng & Units 07/16/2022    3:27 AM 07/15/2022    3:53 AM 07/13/2022    4:22 AM  CBC  WBC 4.0 - 10.5 K/uL 10.5  9.9  12.6   Hemoglobin 13.0 - 17.0 g/dL 12.6  13.2  13.8   Hematocrit 39.0 - 52.0 % 38.4  40.1  41.5   Platelets 150 - 400 K/uL 553  569  567     CBG (last 3)  Recent Labs    07/17/22 1628 07/17/22 2238 07/18/22 0728  GLUCAP 104* 92 98       Lillyanne Bradburn V. Elsworth Soho MD [facility] 07/18/2022, 10:05 AM  See Amion for pager If no response to pager, please call  PCCM consult pager After 7:00 pm call Elink

## 2022-07-18 NOTE — Progress Notes (Signed)
PROGRESS NOTE    Wayne Craig  WUJ:[id number] DOB: 1976-03-30 DOA: 07/10/2022 PCP: Patient, No Pcp Per     Brief Narrative:  no significant past medical history who apparently had RSV infection earlier this month,  He was diagnosed with pneumonia 10 days ago and was given oral antibiotics (Augmentin and doxycycline) without significant improvement.  He presented to the ED due to epigastric abdominal pain, pleuritic chest pain. dyspnea, cough   CT chest with contrast on presentation showed : . Large left pleural effusion with collapse of the left lower lobe. Residual segmental aeration in the left upper lobe. 2. Heterogeneous enhancement and locules of air within the lower lobe suggesting pulmonary necrosis. 3. Cavitary lesions in the medial aspect of the right lower lobe compatible with cavitary pneumonia. Atypical agents and fungal infection should be considered. Septic emboli are also considered.  Subjective:  No acute interval changes WBC normalized  no fever, on room air, no hypoxia, no edema, denies sob  Chest tube removed     Assessment & Plan:  Principal Problem:   HCAP (healthcare-associated pneumonia) Active Problems:   Moderate protein malnutrition (HCC)   Hyperbilirubinemia   Macrocytosis   Pleural effusion on left   Hypomagnesemia   Need for management of chest tube   Aspiration pneumonia of left lower lobe (HCC)   Parapneumonic effusion   Trapped lung   Hydropneumothorax    Assessment and Plan:    Cavitary pneumonia on the right Large left pleural effusion with left lower lobe collapse Sepsis presents on admission with hr>90, leukocytosis, pneumonia -suspect silent aspiration from poor dentition -Status post chest tube placement, removed on 2/4 - cytology negative, pleural fluid culture negative, blood culture negative, wbc normalized -Has been on Zosyn since admission, per pccm  "check with TCTS on 2/5 and if they recommend conservative  approach, then he can be discharged on Augmentin with close follow-up to see if effusion/empyema recurs "   Newly diagnosed diabetes, with hyperglycemia A1c 7.2% Blood glucose on presentation was 219 Started on SSI since admission A.m. fasting blood glucose ranging from 101-1 34 Start carb modified diet, diabetes education Plan to discharge on metformin Follow-up with PCP  Hypokalemia/hypomagnesemia Replace K and mag and improved  Thrombocytosis, mild Likely reactive Monitor  Macrocytosis Tsh wnl,  B12 421,   does reports alcohol use Monitor   Tobacco use Smoking cessation education provided Alcohol use, does not appear to have any issues with alcohol withdrawal  Poor dentition, recommend outpatient dental care      I have Reviewed nursing notes, Vitals, pain scores, I/o's, Lab results and  imaging results since pt's last encounter, details please see discussion above  I ordered the following labs:  Unresulted Labs (From admission, onward)     Start     Ordered   07/19/22 0500  CBC  Every Mon-Wed-Fri (0500),   R     Question:  Specimen collection method  Answer:  Lab=Lab collect   07/17/22 1616   07/19/22 9562  Basic metabolic panel  Every Mon-Wed-Fri (0500),   R     Question:  Specimen collection method  Answer:  Lab=Lab collect   07/17/22 1616   07/17/22 0500  Creatinine, serum  (enoxaparin (LOVENOX)    CrCl >/= 30 ml/min)  Weekly,   R     Comments: while on enoxaparin therapy    07/10/22 1131   07/11/22 0500  Creatinine, serum  Daily,   R      07/10/22 1146  DVT prophylaxis: enoxaparin (LOVENOX) injection 40 mg Start: 07/10/22 2200   Code Status:   Code Status: Full Code  Family Communication: Patient Disposition:   Dispo: The patient is from: Home              Anticipated d/c is to: Home on 2/5 if cleared by pccm                Antimicrobials:   Anti-infectives (From admission, onward)    Start     Dose/Rate Route Frequency  Ordered Stop   07/18/22 1030  piperacillin-tazobactam (ZOSYN) IVPB 3.375 g        3.375 g 12.5 mL/hr over 240 Minutes Intravenous Every 8 hours 07/18/22 0940 07/25/22 1829   07/10/22 2200  vancomycin (VANCOREADY) IVPB 1500 mg/300 mL  Status:  Discontinued        1,500 mg 150 mL/hr over 120 Minutes Intravenous Every 12 hours 07/10/22 1145 07/11/22 1032   07/10/22 1800  piperacillin-tazobactam (ZOSYN) IVPB 3.375 g        3.375 g 12.5 mL/hr over 240 Minutes Intravenous Every 8 hours 07/10/22 1133 07/17/22 2130   07/10/22 1400  piperacillin-tazobactam (ZOSYN) IVPB 3.375 g  Status:  Discontinued        3.375 g 100 mL/hr over 30 Minutes Intravenous Every 8 hours 07/10/22 1122 07/10/22 1133   07/10/22 1000  vancomycin (VANCOREADY) IVPB 1500 mg/300 mL        1,500 mg 150 mL/hr over 120 Minutes Intravenous  Once 07/10/22 0955 07/10/22 1347   07/10/22 1000  ceFEPIme (MAXIPIME) 2 g in sodium chloride 0.9 % 100 mL IVPB        2 g 200 mL/hr over 30 Minutes Intravenous  Once 07/10/22 0955 07/10/22 1139           Objective: Vitals:   07/17/22 0421 07/17/22 1331 07/18/22 0418 07/18/22 1322  BP: 124/78 137/83 129/81 136/83  Pulse: 79 79 85 91  Resp: 20 18 19 16   Temp: 98.2 F (36.8 C) 98.5 F (36.9 C) 98.3 F (36.8 C) 98.7 F (37.1 C)  TempSrc: Oral Oral Oral Oral  SpO2: 94% 98% 99% 96%  Weight:      Height:        Intake/Output Summary (Last 24 hours) at 07/18/2022 1458 Last data filed at 07/18/2022 0845 Gross per 24 hour  Intake 240 ml  Output --  Net 240 ml   Filed Weights   07/10/22 1111 07/10/22 1714  Weight: 88.5 kg 82.1 kg    Examination:  General exam: alert, awake, communicative,calm, NAD Respiratory system: left side chest tube removed, diminished breath sound left side, Respiratory effort normal. Cardiovascular system:  RRR.  Gastrointestinal system: Abdomen is nondistended, soft and nontender.  Normal bowel sounds heard. Central nervous system: Alert and oriented.  No focal neurological deficits. Extremities:  no edema Skin: No rashes, lesions or ulcers Psychiatry: Judgement and insight appear normal. Mood & affect appropriate.     Data Reviewed: I have personally reviewed  labs and visualized  imaging studies since the last encounter and formulate the plan        Scheduled Meds:  enoxaparin (LOVENOX) injection  40 mg Subcutaneous Q24H   insulin aspart  0-9 Units Subcutaneous TID WC   living well with diabetes book   Does not apply Once   pneumococcal 20-valent conjugate vaccine  0.5 mL Intramuscular Tomorrow-1000   Continuous Infusions:  piperacillin-tazobactam (ZOSYN)  IV 3.375 g (07/18/22 1042)     LOS:  8 days   Time spent:  15mins  Florencia Reasons, MD PhD FACP [facility]  Available via Epic secure chat 7am-7pm for nonurgent issues Please page for urgent issues To page the attending provider between 7A-7P or the covering provider during after hours 7P-7A, please log into the web site [email or web address] and access using universal [place] password for that web site. If you do not have the password, please call the hospital operator.    07/18/2022, 2:58 PM

## 2022-07-18 NOTE — Progress Notes (Signed)
Received report from day nurse, now leaving. Pt is A&O x 4. IV flushed off for now, pt may ambulate easier. Pt was administered oxycodone 5 mg @ 1611. Agree with previous shift assessment.

## 2022-07-19 ENCOUNTER — Inpatient Hospital Stay (HOSPITAL_COMMUNITY): Payer: BC Managed Care – PPO

## 2022-07-19 ENCOUNTER — Telehealth: Payer: Self-pay | Admitting: Acute Care

## 2022-07-19 DIAGNOSIS — J189 Pneumonia, unspecified organism: Secondary | ICD-10-CM | POA: Diagnosis not present

## 2022-07-19 LAB — BASIC METABOLIC PANEL
Anion gap: 11 (ref 5–15)
BUN: 8 mg/dL (ref 6–20)
CO2: 24 mmol/L (ref 22–32)
Calcium: 9.2 mg/dL (ref 8.9–10.3)
Chloride: 100 mmol/L (ref 98–111)
Creatinine, Ser: 0.93 mg/dL (ref 0.61–1.24)
GFR, Estimated: >60 mL/min (ref >60)
Glucose, Bld: 97 mg/dL (ref 70–99)
Potassium: 4 mmol/L (ref 3.5–5.1)
Sodium: 135 mmol/L (ref 135–145)

## 2022-07-19 LAB — CBC
HCT: 38.4 % — ABNORMAL LOW (ref 39.0–52.0)
Hemoglobin: 12.7 g/dL — ABNORMAL LOW (ref 13.0–17.0)
MCH: 34.1 pg — ABNORMAL HIGH (ref 26.0–34.0)
MCHC: 33.1 g/dL (ref 30.0–36.0)
MCV: 103.2 fL — ABNORMAL HIGH (ref 80.0–100.0)
Platelets: 519 10*3/uL — ABNORMAL HIGH (ref 150–400)
RBC: 3.72 MIL/uL — ABNORMAL LOW (ref 4.22–5.81)
RDW: 12.7 % (ref 11.5–15.5)
WBC: 9.1 10*3/uL (ref 4.0–10.5)
nRBC: 0 % (ref 0.0–0.2)

## 2022-07-19 LAB — GLUCOSE, CAPILLARY
Glucose-Capillary: 100 mg/dL — ABNORMAL HIGH (ref 70–99)
Glucose-Capillary: 139 mg/dL — ABNORMAL HIGH (ref 70–99)

## 2022-07-19 MED ORDER — AMOXICILLIN-POT CLAVULANATE 875-125 MG PO TABS: 1.0000 | ORAL_TABLET | Freq: Two times a day (BID) | ORAL | 0 refills | Status: AC

## 2022-07-19 MED ORDER — AMOXICILLIN-POT CLAVULANATE 875-125 MG PO TABS: 1.0000 | ORAL_TABLET | Freq: Two times a day (BID) | ORAL | Status: DC

## 2022-07-19 NOTE — Progress Notes (Addendum)
   NAME:  Wayne Craig, MRN:  [id number], DOB:  Nov 12, 1975, LOS: 9 ADMISSION DATE:  07/10/2022, CONSULTATION DATE:  07/10/2022 REFERRING MD:  Dr. Olevia Bowens, [facility], CHIEF COMPLAINT:  Shortness of breath   History of Present Illness:  47 yo male smoker was seen in the ER on 06/30/22 with fever and cough after being presumptive diagnosis at an urgent care center with RSV.  Chest xray on 06/30/22 showed Lt lower lung field infiltrate and effuson, and he was discharged home with doxycycline and augmentin with plan for follow up with his PCP.  He presented to [facility] ER on 07/10/22 with abdominal pain and reports he recently finished therapy for pneumonia, but wasn't feeling improvement.  CT chest showed a very large left pleural effusion with compressive atelectasis of most of the Lt lung, and cavitary lesions in RLL.  Pertinent  Medical History  None  Significant Hospital Events: Including procedures, antibiotic start and stop dates in addition to other pertinent events   1/27 Admit, pigtail catheter inserted 1/29 first dose of tPA 1/30 2nd dose of TPA/dnase 2/3 dc'd pigtail  Interim History / Subjective:  Feels better wants to go home   Objective   Blood pressure 125/81, pulse 82, temperature 98.5 F (36.9 C), temperature source Oral, resp. rate 15, height 6' (1.829 m), weight 82.1 kg, SpO2 97 %.        Intake/Output Summary (Last 24 hours) at 07/19/2022 1155 Last data filed at 07/19/2022 0900 Gross per 24 hour  Intake 1300.07 ml  Output no documentation  Net 1300.07 ml   Filed Weights   07/10/22 1111 07/10/22 1714  Weight: 88.5 kg 82.1 kg    Examination:  General 47 year old male resting in bed HENT NCAT no JVD  Pulm dec left. 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 - likely from silent aspiration in setting of poor dentition.    Plan Day 10 zosyn; change to Augmentin today (will need minimum 4 weeks. Likely longer)  CXR today.  Needs dental f/u as outpt  Case discussed w/ [facility] 2/1. Will await new Cxr and then reach out again. . Will prob eventually need VATS but w/ lung abscess it is felt the lung would be too friable for decortication  If CVTS says no surgery in immediate future then I think he can go home today w/ f/u w/ CVTS or [place] if not going to be surgical candidate.   Tobacco abuse. - smoking cessation counseling  Rest of care per primary team.    Erick Colace ACNP-BC [place] Pager # [phone] OR # [phone] if no answer

## 2022-07-21 NOTE — Telephone Encounter (Signed)
Follow up appt scheduled.

## 2022-07-21 NOTE — Discharge Summary (Signed)
Physician Discharge Summary   Patient: Wayne Craig MRN: [id number] DOB: [birth year]  Admit date:     07/10/2022  Discharge date: 07/19/2022  Discharge Physician: Lorelei Pont   PCP: Patient, No Pcp Per   Recommendations at discharge:    Continue antibiotics as directed Follow up with pulmonology  Follow up with dentistry and PCP  Discharge Diagnoses: Principal Problem:   HCAP (healthcare-associated pneumonia) Active Problems:   Moderate protein malnutrition (HCC)   Hyperbilirubinemia   Macrocytosis   Pleural effusion on left   Hypomagnesemia   Need for management of chest tube   Aspiration pneumonia of left lower lobe (HCC)   Parapneumonic effusion   Trapped lung   Hydropneumothorax  Resolved Problems:   * No resolved hospital problems. *  Hospital Course: 47 y.o M with no significant past medical history who apparently had RSV infection earlier this month,  He was diagnosed with pneumonia 10 days ago and was given oral antibiotics (Augmentin and doxycycline) without significant improvement.  He presented to the ED due to epigastric abdominal pain, pleuritic chest pain. dyspnea, cough and found to have large left pleural effusion with collapse of LLL and sepsis. He had left chest tube placed and removed on 2/5. He was on room air on discharge with recommendation of pulm of 4 weeks of antibiotics and follow up in office.    Assessment and Plan:  Cavitary pneumonia on the right Large left pleural effusion with left lower lobe collapse Sepsis  Sepsis 2/2 left sided PNA with pleural effusion. Suspect silent aspiration from poor dentition. Status post chest tube placement, removed on 2/4. PCCM consulted, d/w CTS and pt was not a candidate for decortication at this time. Will follow up as outpatient. He was on RA on discharge.  - cytology negative, pleural fluid culture negative, blood culture negative, wbc normalized - augmentin x 4 weeks - pulm follow up as outpatient     Newly diagnosed diabetes, with hyperglycemia A1c 7.2% Follow-up with PCP     Tobacco use Smoking cessation education provided  Alcohol use does not appear to have any issues with alcohol withdrawal   Poor dentition, recommend outpatient dental care      Consultants: pulmonology Procedures performed: chest tube  Disposition: Home Diet recommendation:  Discharge Diet Orders (From admission, onward)     Start     Ordered   07/19/22 0000  Diet - low sodium heart healthy        07/19/22 1332           Regular diet DISCHARGE MEDICATION: Allergies as of 07/19/2022   No Known Allergies      Medication List     STOP taking these medications    doxycycline 100 MG capsule Commonly known as: VIBRAMYCIN   HYDROcodone-acetaminophen 5-325 MG tablet Commonly known as: NORCO/VICODIN       TAKE these medications    amoxicillin-clavulanate 875-125 MG tablet Commonly known as: AUGMENTIN Take 1 tablet by mouth 2 (two) times daily for 28 days. What changed: when to take this   multivitamin with minerals Tabs tablet Take 1 tablet by mouth daily.   omeprazole 20 MG tablet Commonly known as: PRILOSEC OTC Take 20 mg by mouth every other day.        Discharge Exam: Filed Weights   07/10/22 1111 07/10/22 1714  Weight: 88.5 kg 82.1 kg   Physical Exam Vitals and nursing note reviewed.  Constitutional:      General: He is not in acute  distress.    Appearance: He is not diaphoretic.  HENT:     Head: Atraumatic.  Eyes:     Pupils: Pupils are equal, round, and reactive to light.  Cardiovascular:     Rate and Rhythm: Normal rate and regular rhythm.     Pulses: Normal pulses.     Heart sounds: No murmur heard. Pulmonary:     Effort: Pulmonary effort is normal. No respiratory distress.     Breath sounds: Examination of the left-lower field reveals decreased breath sounds. Decreased breath sounds present. No rales.     Comments: Left sided chest tube site C/D/I.   Abdominal:     General: Abdomen is flat. There is no distension.     Palpations: Abdomen is soft.     Tenderness: There is no abdominal tenderness. There is no rebound.  Musculoskeletal:     Right lower leg: No edema.     Left lower leg: No edema.  Skin:    General: Skin is warm and dry.     Capillary Refill: Capillary refill takes less than 2 seconds.  Neurological:     Mental Status: He is alert and oriented to person, place, and time. Mental status is at baseline.  Psychiatric:        Mood and Affect: Mood normal.     Condition at discharge: good  The results of significant diagnostics from this hospitalization (including imaging, microbiology, ancillary and laboratory) are listed below for reference.   Imaging Studies: DG Chest Port 1 View  Result Date: 07/19/2022 CLINICAL DATA:  Empyema EXAM: PORTABLE CHEST 1 VIEW COMPARISON:  Chest x-rays dated 07/17/2022 07/14/2022. Chest CT dated 07/15/2022. FINDINGS: LEFT-sided chest tube has been removed. Persistent opacity at the LEFT lung base, compatible with the combination of airspace consolidation and small LEFT hydropneumothorax better demonstrated on most recent chest CT of 07/15/2022. RIGHT lung is clear. Heart size and mediastinal contours are stable. IMPRESSION: 1. LEFT-sided chest tube has been removed.  Otherwise stable exam. 2. Persistent opacity at the LEFT lung base, compatible with the combination of airspace consolidation and small LEFT hydropneumothorax better demonstrated on most recent chest CT of 07/15/2022. Electronically Signed   By: Bary Richard M.D.   On: 07/19/2022 13:01   DG CHEST PORT 1 VIEW  Result Date: 07/17/2022 CLINICAL DATA:  Diagnosed with pneumonia and empyema.  Follow-up. EXAM: PORTABLE CHEST 1 VIEW COMPARISON:  07/14/2022 plain film.  Chest CT 07/15/2022 FINDINGS: Numerous leads and wires project over the chest. Tracheal deviation left with volume loss in the left hemithorax. Normal heart size. Mild left  hemidiaphragm elevation. Left-sided pigtail pleural catheter has been retracted and only projects minimally over the inferolateral left hemithorax. The extent of left-sided pleural fluid and thickening is similar. The pleural air component is primarily identified adjacent the superior left mediastinum and is similar to the prior plain film. Relatively similar left mid and lower lung airspace disease. Right lung remains clear. IMPRESSION: No significant change since plain film of 07/14/2022. Left-sided hydropneumothorax with left mid and lower lung airspace disease. Retraction of left-sided chest tube, now only minimally positioned with tip over the inferolateral left hemithorax. Electronically Signed   By: Jeronimo Greaves M.D.   On: 07/17/2022 12:42   CT CHEST W CONTRAST  Result Date: 07/15/2022 CLINICAL DATA:  Suspected parapneumonic effusion in a 47 year old male. EXAM: CT CHEST WITH CONTRAST TECHNIQUE: Multidetector CT imaging of the chest was performed during intravenous contrast administration. RADIATION DOSE REDUCTION: This exam was performed  according to the departmental dose-optimization program which includes automated exposure control, adjustment of the mA and/or kV according to patient size and/or use of iterative reconstruction technique. CONTRAST:  48mL OMNIPAQUE IOHEXOL 300 MG/ML  SOLN COMPARISON:  July 10, 2022 FINDINGS: Cardiovascular: No pericardial effusion nodularity. Stable heart size. Aortic caliber is normal. Central pulmonary vasculature with normal caliber. Mediastinum/Nodes: No acute mediastinal process or signs of lymphadenopathy in the chest. Lungs/Pleura: Moderate LEFT-sided pneumothorax. No contralateral shift of mediastinal structures, in fact there may be mild shift towards the affected chest. There is persistent dense lower lobe consolidation. Locules of gas within lung parenchyma appear to communicate with the pleural space in the posterior LEFT chest. Pneumothorax is both  anterior and sub pulmonic. Abundant pleural thickening without substantial pleural fluid remaining. Necrotic appearing areas in the LEFT chest, for instance on image 90/5 minutes approximately 1.9 cm likely corresponding to an area of low attenuation within collapsed lung seen on prior imaging. There is only partial re-expansion of lung on the current study. Chest tube in place with sideholes at the edge of the chest cavity. Trace chest wall emphysema. RIGHT lower lobe multiloculated area is similar to previous imaging dominant area measuring 15 mm with a slightly smaller area just anterior to this and a nother small area along the pleural margin best seen on image 121/5. No RIGHT-sided pneumothorax. No RIGHT-sided pleural effusion. Airways are patent with respect to large airways with fluid in the LEFT lower lobe airways subtending persistently collapsed and consolidated/necrotic lung. Upper Abdomen: Hepatic steatosis. No acute findings in the upper abdomen to the extent evaluated. Musculoskeletal: Trace chest wall emphysema. No chest wall mass. Small presumed sebaceous cyst over the anterior inferior aspect of the sternum in the subcutaneous fat (image 143/2. IMPRESSION: 1. Moderate LEFT pneumothorax, potentially pneumothorax ex vacuo given persistent necrotic and collapsed lung at the LEFT lung base, also given that there is no shift of mediastinal structures away from the LEFT chest to indicate tension physiology. Cystic areas may communicate with the pleural space. Attention on follow-up is suggested as this patient may be at risk for developing bronchopleural fistula. 2. LEFT chest tube may benefit from repositioning given sideholes at the edge of LEFT hemithorax, associated with small amounts of LEFT chest wall emphysema. Would also correlate with tube function and respiratory phasicity though persistent pneumothorax could be due to presence of trapped lung and necrosis/consolidation outlined above. 3. Cystic  necrotic areas in the RIGHT lower lobe along the pleural surface, these are similar to prior imaging. For this reason the patient could be at risk for developing RIGHT pneumothorax. Close attention on follow-up is suggested. 4. Abundant pleural thickening with only minimal pleural fluid remaining in the LEFT chest. 5. Suggest follow-up of above findings to ensure resolution. 6. Hepatic steatosis. Correlate with any signs of metabolic or hepatic dysfunction. These results will be called to the ordering clinician or representative by the Radiologist Assistant, and communication documented in the PACS or [employer]. Electronically Signed   By: Zetta Bills M.D.   On: 07/15/2022 10:27   DG CHEST PORT 1 VIEW  Result Date: 07/14/2022 CLINICAL DATA:  LEFT chest tube EXAM: PORTABLE CHEST 1 VIEW COMPARISON:  Portable exam 0439 hours compared to 07/13/2022 FINDINGS: Pigtail LEFT thoracostomy tube unchanged. Stable heart size and mediastinal contours. Persistent small LEFT pleural effusion and LEFT basilar atelectasis. Aeration slightly improved in LEFT lung since previous exam. Mildly increased RIGHT basilar atelectasis. Questionable small loculated pneumothorax along the mediastinum. Osseous  structures unremarkable. IMPRESSION: Persistent LEFT pleural effusion and basilar atelectasis, minimally improved. Suspect small loculated pneumothorax along the mediastinum despite LEFT thoracostomy tube. Increased RIGHT basilar atelectasis. Electronically Signed   By: Lavonia Dana M.D.   On: 07/14/2022 08:00   DG CHEST PORT 1 VIEW  Result Date: 07/13/2022 CLINICAL DATA:  F/u chest tube EXAM: PORTABLE CHEST - 1 VIEW COMPARISON:  the previous day's study FINDINGS: Pigtail chest tube is partially formed laterally at the left lung base. Some improvement in the left pleural effusion with moderate residual. Partially improved aeration at the left lung base. Right lung remains clear. Heart size difficult to assess due to  adjacent opacities. No pneumothorax. Visualized bones unremarkable. IMPRESSION: Partial improvement in left pleural effusion and left basilar airspace disease. Electronically Signed   By: Lucrezia Europe M.D.   On: 07/13/2022 08:06   DG Chest Port 1 View  Result Date: 07/12/2022 CLINICAL DATA:  LEFT pleural effusion EXAM: PORTABLE CHEST 1 VIEW COMPARISON:  Portable exam 0254 hours compared to 07/11/2022 FINDINGS: Pigtail LEFT thoracostomy tube again identified. Heart obscured by large LEFT pleural effusion with significant atelectasis of LEFT lung. RIGHT lung clear. No pneumothorax or acute osseous findings. IMPRESSION: Increase in LEFT pleural effusion and LEFT lung atelectasis since prior study despite thoracostomy tube. Electronically Signed   By: Lavonia Dana M.D.   On: 07/12/2022 07:54   DG Chest Port 1 View  Result Date: 07/11/2022 CLINICAL DATA:  Left pleural effusion. EXAM: PORTABLE CHEST 1 VIEW COMPARISON:  07/10/2022 FINDINGS: Large left pleural effusion again noted with left pleural drain still in place. Probable component of pleural gas in the lateral left mid hemithorax on today's study. Right lung remains clear. Left heart is obscured by left base collapse/consolidation and left pleural effusion. Telemetry leads overlie the chest. IMPRESSION: Large left pleural effusion with left pleural drain still in place. No substantial interval change. Electronically Signed   By: Misty Stanley M.D.   On: 07/11/2022 06:52   DG Chest Port 1 View  Result Date: 07/10/2022 CLINICAL DATA:  Chest tube placement. EXAM: PORTABLE CHEST 1 VIEW COMPARISON:  Same day. FINDINGS: Interval placement of left-sided pleural drainage catheter. Left pleural effusion appears to be significantly smaller. No definite pneumothorax is noted. Right lung is clear. IMPRESSION: Interval placement of left-sided pleural drainage catheter. Left pleural effusion appears to be significantly smaller. Electronically Signed   By: Marijo Conception  M.D.   On: 07/10/2022 13:53   CT Chest W Contrast  Result Date: 07/10/2022 CLINICAL DATA:  Pneumonia and pleural effusion. EXAM: CT CHEST WITH CONTRAST TECHNIQUE: Multidetector CT imaging of the chest was performed during intravenous contrast administration. RADIATION DOSE REDUCTION: This exam was performed according to the departmental dose-optimization program which includes automated exposure control, adjustment of the mA and/or kV according to patient size and/or use of iterative reconstruction technique. CONTRAST:  39mL OMNIPAQUE IOHEXOL 300 MG/ML  SOLN COMPARISON:  Two-view chest x-ray 07/10/2022 FINDINGS: Cardiovascular: Heart size is normal. Aorta and great vessel origins are normal. Pulmonary arteries are unremarkable. Mediastinum/Nodes: No enlarged mediastinal, hilar, or axillary lymph nodes. Thyroid gland, trachea, and esophagus demonstrate no significant findings. Lungs/Pleura: A large pleural effusion is present. Anterior segment of the left upper lobe is aerated. The lung is otherwise collapsed. The lower lobe demonstrates heterogeneous perfusion and locules of air suggesting pulmonary necrosis. No discrete mass lesion is present. Cavitary lesions are present in the medial aspect of the right lower lobe. The right lung is otherwise  clear. No significant right pleural effusion is present. Upper Abdomen: Limited imaging the abdomen is unremarkable. There is no significant adenopathy. No solid organ lesions are present. Musculoskeletal: Vertebral body heights and alignment are normal. No acute or focal osseous lesions are present. IMPRESSION: 1. Large left pleural effusion with collapse of the left lower lobe. Residual segmental aeration in the left upper lobe. 2. Heterogeneous enhancement and locules of air within the lower lobe suggesting pulmonary necrosis. 3. Cavitary lesions in the medial aspect of the right lower lobe compatible with cavitary pneumonia. Atypical agents and fungal infection  should be considered. Septic emboli are also considered. These results were called by telephone at the time of interpretation on 07/10/2022 at 10:56 am to provider [facility] , who verbally acknowledged these results. Electronically Signed   By: Marin Roberts M.D.   On: 07/10/2022 11:06   DG Chest 2 View  Result Date: 07/10/2022 CLINICAL DATA:  47 year old male with shortness of breath. Productive cough. RSV diagnosis earlier this month. EXAM: CHEST - 2 VIEW COMPARISON:  Chest radiographs 06/30/2022. FINDINGS: PA and lateral views at 0812 hours. Substantially progressed and now large left pleural effusion with only small volume of residual aerated medial left upper lobe. Some mediastinal shift to the right. Contralateral right lung appears fairly clear. Visible mediastinal contours are within normal limits. Visualized tracheal air column is within normal limits. No acute osseous abnormality identified. Negative visible bowel gas. IMPRESSION: Large Left pleural effusion, substantially progressed since 06/30/2022. Only small volume residual aerated left lung. Electronically Signed   By: Odessa Fleming M.D.   On: 07/10/2022 08:24   DG Chest 2 View  Result Date: 06/30/2022 CLINICAL DATA:  Shortness of breath. 10 days of feeling sick, coughing, and with fever. Diagnosed with RSV 4 days ago. EXAM: CHEST - 2 VIEW COMPARISON:  None Available. FINDINGS: Cardiac silhouette appears normal in size. Mediastinal contours are within normal limits. Mildly decreased lung volumes. Moderate left basilar opacification, likely combination of a airspace opacification and mild-to-moderate pleural fluid. Mild right basilar horizontal linear interstitial thickening/subsegmental atelectasis. No pneumothorax. Minimal multilevel degenerative disc changes of the thoracic spine. IMPRESSION: 1. Moderate left basilar opacification, likely a combination of airspace opacification (atelectasis versus pneumonia) and mild-to-moderate left  pleural fluid. 2. Mild right basilar subsegmental atelectasis. Electronically Signed   By: Neita Garnet M.D.   On: 06/30/2022 08:22    Microbiology: Results for orders placed or performed during the hospital encounter of 07/10/22  Resp panel by RT-PCR (RSV, Flu A&B, Covid) Anterior Nasal Swab     Status: None   Collection Time: 07/10/22  7:55 AM   Specimen: Anterior Nasal Swab  Result Value Ref Range Status   SARS Coronavirus 2 by RT PCR NEGATIVE NEGATIVE Final    Comment: (NOTE) SARS-CoV-2 target nucleic acids are NOT DETECTED.  The SARS-CoV-2 RNA is generally detectable in upper respiratory specimens during the acute phase of infection. The lowest concentration of SARS-CoV-2 viral copies this assay can detect is 138 copies/mL. A negative result does not preclude SARS-Cov-2 infection and should not be used as the sole basis for treatment or other patient management decisions. A negative result may occur with  improper specimen collection/handling, submission of specimen other than nasopharyngeal swab, presence of viral mutation(s) within the areas targeted by this assay, and inadequate number of viral copies(<138 copies/mL). A negative result must be combined with clinical observations, patient history, and epidemiological information. The expected result is Negative.  Fact Sheet for Patients:  BloggerCourse.comhttps://www.fda.gov/media/152166/download  Fact Sheet for Healthcare Providers:  SeriousBroker.ithttps://www.fda.gov/media/152162/download  This test is no t yet approved or cleared by the [place] FDA and  has been authorized for detection and/or diagnosis of SARS-CoV-2 by FDA under an Emergency Use Authorization (EUA). This EUA will remain  in effect (meaning this test can be used) for the duration of the COVID-19 declaration under Section 564(b)(1) of the Act, 21 U.S.C.section 360bbb-3(b)(1), unless the authorization is terminated  or revoked sooner.       Influenza A by PCR NEGATIVE  NEGATIVE Final   Influenza B by PCR NEGATIVE NEGATIVE Final    Comment: (NOTE) The Xpert Xpress SARS-CoV-2/FLU/RSV plus assay is intended as an aid in the diagnosis of influenza from Nasopharyngeal swab specimens and should not be used as a sole basis for treatment. Nasal washings and aspirates are unacceptable for Xpert Xpress SARS-CoV-2/FLU/RSV testing.  Fact Sheet for Patients: BloggerCourse.comhttps://www.fda.gov/media/152166/download  Fact Sheet for Healthcare Providers: SeriousBroker.ithttps://www.fda.gov/media/152162/download  This test is not yet approved or cleared by the [place] FDA and has been authorized for detection and/or diagnosis of SARS-CoV-2 by FDA under an Emergency Use Authorization (EUA). This EUA will remain in effect (meaning this test can be used) for the duration of the COVID-19 declaration under Section 564(b)(1) of the Act, 21 U.S.C. section 360bbb-3(b)(1), unless the authorization is terminated or revoked.     Resp Syncytial Virus by PCR NEGATIVE NEGATIVE Final    Comment: (NOTE) Fact Sheet for Patients: BloggerCourse.comhttps://www.fda.gov/media/152166/download  Fact Sheet for Healthcare Providers: SeriousBroker.ithttps://www.fda.gov/media/152162/download  This test is not yet approved or cleared by the [place] FDA and has been authorized for detection and/or diagnosis of SARS-CoV-2 by FDA under an Emergency Use Authorization (EUA). This EUA will remain in effect (meaning this test can be used) for the duration of the COVID-19 declaration under Section 564(b)(1) of the Act, 21 U.S.C. section 360bbb-3(b)(1), unless the authorization is terminated or revoked.  Performed at [facility], [address] [place], [place] [address]   Blood culture (routine x 2)     Status: None   Collection Time: 07/10/22  9:29 AM   Specimen: BLOOD  Result Value Ref Range Status   Specimen Description   Final    BLOOD SITE NOT SPECIFIED Performed at [facility], [address] [place], [place] [address]    Special Requests   Final    BOTTLES DRAWN AEROBIC AND ANAEROBIC Blood Culture results may not be optimal due to an inadequate volume of blood received in culture bottles Performed at [facility], [address] [place], [place] [address]    Culture   Final    NO GROWTH 5 DAYS Performed at [facility], [address] [place], [place] [address]    Report Status 07/15/2022 FINAL  Final  Blood culture (routine x 2)     Status: None   Collection Time: 07/10/22 10:04 AM   Specimen: BLOOD  Result Value Ref Range Status   Specimen Description   Final    BLOOD SITE NOT SPECIFIED Performed at [facility], [address] [place], [place] [address]    Special Requests   Final    BOTTLES DRAWN AEROBIC AND ANAEROBIC Blood Culture results may not be optimal due to an inadequate volume of blood received in culture bottles Performed at [facility], [address] [place], [place] [address]    Culture   Final    NO GROWTH  5 DAYS Performed at [facility], [address] [place], [place] [address]    Report Status 07/15/2022 FINAL  Final  Expectorated Sputum Assessment w Gram Stain, Rflx to Resp Cult     Status: None   Collection Time: 07/10/22 11:30 AM   Specimen: Expectorated Sputum  Result Value Ref Range Status   Specimen Description EXPECTORATED SPUTUM  Final   Special Requests NONE  Final   Sputum evaluation   Final    Sputum specimen not acceptable for testing.  Please recollect.   NOTIFIED [facility] RN ON 07/07/22 AT 1544 BY MG Performed at [facility], [address], [place], [place] [address]    Report Status 07/10/2022 FINAL  Final  MRSA Next Gen by PCR, Nasal     Status: None   Collection Time: 07/10/22 11:40 AM   Specimen: Nasal Mucosa; Nasal Swab  Result Value Ref Range Status   MRSA by PCR Next Gen NOT DETECTED NOT DETECTED Final    Comment:  (NOTE) The GeneXpert MRSA Assay (FDA approved for NASAL specimens only), is one component of a comprehensive MRSA colonization surveillance program. It is not intended to diagnose MRSA infection nor to guide or monitor treatment for MRSA infections. Test performance is not FDA approved in patients less than 64 years old. Performed at [facility], [address] [place], [place] [address]   Body fluid culture w Gram Stain     Status: None   Collection Time: 07/10/22  1:19 PM   Specimen: Pleura; Body Fluid  Result Value Ref Range Status   Specimen Description   Final    PLEURAL Performed at [facility], [address] [place], [place] [address]    Special Requests   Final    NONE Performed at [facility], [address] [place], [place] [address]    Gram Stain NO ORGANISMS SEEN NO WBC SEEN   Final   Culture   Final    NO GROWTH 3 DAYS Performed at [facility], [address], [place], [place] [address]    Report Status 07/14/2022 FINAL  Final    Labs: CBC: Recent Labs  Lab 07/15/22 0353 07/16/22 0327 07/19/22 0719  WBC 9.9 10.5 9.1  NEUTROABS 6.9 7.8*  --   HGB 13.2 12.6* 12.7*  HCT 40.1 38.4* 38.4*  MCV 102.6* 104.1* 103.2*  PLT 569* 553* 519*   Basic Metabolic Panel: Recent Labs  Lab 07/15/22 0353 07/16/22 0327 07/17/22 0425 07/18/22 0331 07/19/22 0719  NA 134* 136 138  --  135  K 3.6 3.7 4.0  --  4.0  CL 99 98 103  --  100  CO2 23 24 28   --  24  GLUCOSE 105* 101* 110*  --  97  BUN 8 8 8   --  8  CREATININE 0.78 0.96 0.92 0.93 0.93  CALCIUM 8.5* 8.6* 8.9  --  9.2  MG  --  1.7  --   --   --   PHOS  --  3.5  --   --   --    Liver Function Tests: No results for input(s): "AST", "ALT", "ALKPHOS", "BILITOT", "PROT", "ALBUMIN" in the last 168 hours. CBG: Recent Labs  Lab 07/18/22 1155 07/18/22 1715 07/18/22 2151 07/19/22 0838 07/19/22 1148  GLUCAP 78 101* 107* 100* 139*     Discharge time spent: less than 30 minutes.  Signed: 09/17/22, MD [facility] 07/21/2022

## 2022-07-29 ENCOUNTER — Ambulatory Visit (INDEPENDENT_AMBULATORY_CARE_PROVIDER_SITE_OTHER): Payer: BC Managed Care – PPO

## 2022-07-29 ENCOUNTER — Encounter (HOSPITAL_BASED_OUTPATIENT_CLINIC_OR_DEPARTMENT_OTHER): Payer: Self-pay | Admitting: Pulmonary Disease

## 2022-07-29 ENCOUNTER — Ambulatory Visit (INDEPENDENT_AMBULATORY_CARE_PROVIDER_SITE_OTHER): Payer: BC Managed Care – PPO | Admitting: Pulmonary Disease

## 2022-07-29 VITALS — BP 144/80 | HR 81 | Ht 72.0 in | Wt 186.8 lb

## 2022-07-29 DIAGNOSIS — J189 Pneumonia, unspecified organism: Secondary | ICD-10-CM

## 2022-07-29 DIAGNOSIS — J869 Pyothorax without fistula: Secondary | ICD-10-CM | POA: Diagnosis not present

## 2022-07-29 DIAGNOSIS — Z720 Tobacco use: Secondary | ICD-10-CM

## 2022-07-29 DIAGNOSIS — J690 Pneumonitis due to inhalation of food and vomit: Secondary | ICD-10-CM

## 2022-07-29 NOTE — Progress Notes (Signed)
   Subjective:    Patient ID: Wayne Craig, male    DOB: 1975-09-05, 47 y.o.   MRN: [id number]  HPI  47 yo male smoker was seen in the ER on 06/30/22 with fever and cough after presumptive diagnosis at an urgent care center with RSV. Chest xray on 06/30/22 showed Lt lower lung field infiltrate and effuson, and he was discharged home with doxycycline and augmentin with plan for follow up with his PCP. He presented to [facility] ER on 07/10/22 with abdominal pain  CT chest showed a very large left pleural effusion with compressive atelectasis of most of the Lt lung, and cavitary lesions in RLL.   Left empyema status post pigtail and intrapleural lytics Cavitary pneumonia versus lung abscess - likely from silent aspiration in setting of poor dentition.    most of the effusion is evacuated on repeat CT 2/1, but now he has a hydropneumothorax and entrapped lung.   Significant tests/ events reviewed 1/27 Admit, pigtail catheter inserted 1/29 first dose of tPA 1/30 2nd dose of TPA/dnase 2/1 CT chest >>Moderate LEFT pneumothorax, potentially pneumothorax ex vacuo given persistent necrotic and collapsed lung at the LEFT lung base.Abundant pleural thickening with only minimal pleural fluid remaining in the LEFT chest.  Cystic necrotic areas in the RIGHT lower lobe along the pleural surface, Case d/w  with TCTS 2/1--recommend consideration for VATS later once the lung abscesses have healed some; at this point the lung would be too fragile for decortication, but he may be a good candiate for this in a few weeks.  2/3 dc'd pigtail   Post hospital follow-up visit. He was discharged on 2/5, has been taking his antibiotics.  No fevers, initially had a cough which is now resolved.  No chest pain.  Overall he is feeling better able to walk around.  He is thinking of going back to work. He works at [employer] Unfortunately he has started smoking again after discharge  Review of Systems neg for any significant  sore throat, dysphagia, itching, sneezing, nasal congestion or excess/ purulent secretions, fever, chills, sweats, unintended wt loss, pleuritic or exertional cp, hempoptysis, orthopnea pnd or change in chronic leg swelling. Also denies presyncope, palpitations, heartburn, abdominal pain, nausea, vomiting, diarrhea or change in bowel or urinary habits, dysuria,hematuria, rash, arthralgias, visual complaints, headache, numbness weakness or ataxia.     Objective:   Physical Exam  Gen. Pleasant, well-nourished, in no distress ENT - no thrush, no pallor/icterus,no post nasal drip Neck: No JVD, no thyromegaly, no carotid bruits Lungs: no use of accessory muscles, no dullness to percussion, decreased left base without rales or rhonchi  Cardiovascular: Rhythm regular, heart sounds  normal, no murmurs or gallops, no peripheral edema Musculoskeletal: No deformities, no cyanosis or clubbing        Assessment & Plan:

## 2022-07-29 NOTE — Patient Instructions (Addendum)
x chest x-ray today  x schedule CT chest wo con after you complete your antibiotics first week of March  Try to quit smoking!

## 2022-07-29 NOTE — Assessment & Plan Note (Signed)
Cavitary pneumonia Advised him to see a dentist for poor dentition Smoking cessation strongly encouraged

## 2022-07-29 NOTE — Assessment & Plan Note (Signed)
Smoking cessation was strongly encouraged

## 2022-07-29 NOTE — Assessment & Plan Note (Addendum)
He underwent pigtail chest tube drainage with intrapleural lytics with good results.  He had residual hydropneumothorax with a thick pleural peel, felt to be necrotic lung, with trapping and the lung would be too friable for decortication.  He seems to be clinically doing well since discharge and is compliant with antibiotics. Will repeat chest x-ray today. He will complete 4 weeks of antibiotics postdischarge in the first week of March and we will repeat CT chest without contrast at that point.  If he has no fevers or clinical deterioration, then we may be able to avoid surgery here  Addendum -chest x-ray shows improved aeration left lower lobe with volume loss

## 2022-08-02 ENCOUNTER — Telehealth: Payer: Self-pay

## 2022-08-02 NOTE — Telephone Encounter (Signed)
Patient states employer requested a work note for patient to come back to work.  Will pend letter and patient states he will pick up from [facility] office tomorrow if Dr.A lva is okay with him going back to work.

## 2022-08-03 NOTE — Telephone Encounter (Signed)
Letter pending and will need to be printed when patient comes to [facility] today to collect.

## 2022-08-19 ENCOUNTER — Ambulatory Visit
Admission: RE | Admit: 2022-08-19 | Discharge: 2022-08-19 | Disposition: A | Discharge status: Home / Self Care | Payer: BC Managed Care – PPO | Source: Ambulatory Visit | Attending: Pulmonary Disease | Admitting: Pulmonary Disease

## 2022-08-19 DIAGNOSIS — J690 Pneumonitis due to inhalation of food and vomit: Secondary | ICD-10-CM

## 2022-08-23 ENCOUNTER — Ambulatory Visit (HOSPITAL_BASED_OUTPATIENT_CLINIC_OR_DEPARTMENT_OTHER): Payer: BC Managed Care – PPO

## 2022-08-23 ENCOUNTER — Ambulatory Visit (HOSPITAL_BASED_OUTPATIENT_CLINIC_OR_DEPARTMENT_OTHER): Payer: BC Managed Care – PPO | Admitting: Pulmonary Disease

## 2022-08-26 ENCOUNTER — Ambulatory Visit (HOSPITAL_BASED_OUTPATIENT_CLINIC_OR_DEPARTMENT_OTHER): Payer: BC Managed Care – PPO

## 2022-08-26 ENCOUNTER — Ambulatory Visit (INDEPENDENT_AMBULATORY_CARE_PROVIDER_SITE_OTHER): Payer: BC Managed Care – PPO | Admitting: Pulmonary Disease

## 2022-08-26 ENCOUNTER — Encounter (HOSPITAL_BASED_OUTPATIENT_CLINIC_OR_DEPARTMENT_OTHER): Payer: Self-pay | Admitting: Pulmonary Disease

## 2022-08-26 VITALS — BP 130/80 | HR 90 | Temp 98.2°F | Ht 72.0 in | Wt 188.8 lb

## 2022-08-26 DIAGNOSIS — J189 Pneumonia, unspecified organism: Secondary | ICD-10-CM

## 2022-08-26 DIAGNOSIS — J869 Pyothorax without fistula: Secondary | ICD-10-CM | POA: Diagnosis not present

## 2022-08-26 NOTE — Progress Notes (Signed)
   Subjective:    Patient ID: Wayne Craig, male    DOB: September 09, 1975, 47 y.o.   MRN: [id number]  HPI  47 year old smoker was hospitalized with community-acquired pneumonia, left empyema after RSV infection  Left empyema status post pigtail and intrapleural lytics Cavitary pneumonia versus lung abscess - likely from silent aspiration in setting of poor dentition.    most of the effusion was evacuated on repeat CT 2/1, but he had a hydropneumothorax and entrapped lung.   He took prolonged antibiotics for almost 6 weeks.  Feels 90% back to baseline.  Reports a hoarse voice but this is also improved almost 95%.  He is back to smoking 3 to 4 cigarettes daily. Has a dry cough, no sputum production, no fevers. Yesterday he did have an episode of left-sided chest pain but this resolved spontaneously. We reviewed chest CT today   Significant tests/ events reviewed 1/27 Admit, pigtail catheter inserted 1/29 first dose of tPA 1/30 2nd dose of TPA/dnase 2/1 CT chest >>Moderate LEFT pneumothorax, potentially pneumothorax ex vacuo given persistent necrotic and collapsed lung at the LEFT lung base.Abundant pleural thickening with only minimal pleural fluid remaining in the LEFT chest.  Cystic necrotic areas in the RIGHT lower lobe along the pleural surface, 2/3 dc'd pigtail 3/7 CT chest improved aeration of left lung, resolved left pleural effusion, with very small residual loculated pneumothorax left, small residual cavitary lesion in the left lower lobe, resolved cavitary lesions in right lower lobe, diffuse pleural thickening improved  Review of Systems neg for any significant sore throat, dysphagia, itching, sneezing, nasal congestion or excess/ purulent secretions, fever, chills, sweats, unintended wt loss, pleuritic or exertional cp, hempoptysis, orthopnea pnd or change in chronic leg swelling. Also denies presyncope, palpitations, heartburn, abdominal pain, nausea, vomiting, diarrhea or change in  bowel or urinary habits, dysuria,hematuria, rash, arthralgias, visual complaints, headache, numbness weakness or ataxia.     Objective:   Physical Exam  Gen. Pleasant, well-nourished, in no distress ENT - no thrush, no pallor/icterus,no post nasal drip Neck: No JVD, no thyromegaly, no carotid bruits Lungs: no use of accessory muscles, no dullness to percussion, decreased at left base without rales or rhonchi  Cardiovascular: Rhythm regular, heart sounds  normal, no murmurs or gallops, no peripheral edema Musculoskeletal: No deformities, no cyanosis or clubbing         Assessment & Plan:

## 2022-08-26 NOTE — Patient Instructions (Signed)
X CXR today to follow up pneumonia  X CXR in  46month

## 2022-08-26 NOTE — Assessment & Plan Note (Signed)
Overall significant improvement in imaging, effusion has completely resolved.  Small cavitary lesion and loculated pneumothorax still present with a thick peel over pleura -no indication of trapped lung, hopefully this will improve with time.  This is significantly improved and clinically not consistent with bronchopleural fistula Due to his complaint of chest pain yesterday will obtain chest x-ray -this shows significantly improved aeration on the left, no effusion. Repeat chest x-ray in 1 month and follow until resolution. I have again asked him to contact us should he develop unexplained persistent chest pain, fevers or coughing

## 2022-09-27 ENCOUNTER — Ambulatory Visit (HOSPITAL_BASED_OUTPATIENT_CLINIC_OR_DEPARTMENT_OTHER): Payer: BC Managed Care – PPO

## 2022-09-27 ENCOUNTER — Telehealth (HOSPITAL_BASED_OUTPATIENT_CLINIC_OR_DEPARTMENT_OTHER): Payer: Self-pay | Admitting: Pulmonary Disease

## 2022-09-27 DIAGNOSIS — J189 Pneumonia, unspecified organism: Secondary | ICD-10-CM

## 2022-09-27 NOTE — Telephone Encounter (Signed)
Called and spoke with patient. Patient stated that he wasn't feeling bad or anything and that if it continues he would give [place] a call.   Nothing further needed.

## 2022-09-29 ENCOUNTER — Telehealth (HOSPITAL_BASED_OUTPATIENT_CLINIC_OR_DEPARTMENT_OTHER): Payer: Self-pay | Admitting: Pulmonary Disease

## 2022-09-29 NOTE — Telephone Encounter (Signed)
Fever 99.8 coughing, scratchy throat, not taking anything otc.   Per last note 08/26/22 from Dr. Vassie Loll I have again asked him to contact us should he develop unexplained persistent chest pain, fevers or coughing

## 2022-09-29 NOTE — Telephone Encounter (Signed)
Patient calling back stating his symptoms are worsening and now not feeling well and was told to call us if that happens. CXR 4/15. Please advise and call patient back.

## 2022-09-30 ENCOUNTER — Other Ambulatory Visit: Payer: Self-pay | Admitting: Pulmonary Disease

## 2022-09-30 MED ORDER — AMOXICILLIN-POT CLAVULANATE 875-125 MG PO TABS: 1.0000 | ORAL_TABLET | Freq: Two times a day (BID) | ORAL | 0 refills | Status: AC

## 2022-09-30 NOTE — Telephone Encounter (Signed)
Called and spoke with patient. He verbalized understanding. ? ?Nothing further needed at time of call.  ?

## 2022-09-30 NOTE — Progress Notes (Signed)
Prescription for Augmentin for 7 days sent to pharmacy

## 2022-09-30 NOTE — Telephone Encounter (Signed)
Since Dr. Vassie Loll is not in the office and due to pt's symptoms, going ahead and routing this to provider of the day for review to see if they have any advice for pt.  Dr. Val Eagle, please advise.

## 2022-09-30 NOTE — Telephone Encounter (Signed)
Augmentin for 7 days sent to pharmacy

## 2022-10-22 ENCOUNTER — Ambulatory Visit (INDEPENDENT_AMBULATORY_CARE_PROVIDER_SITE_OTHER): Payer: BC Managed Care – PPO | Admitting: Pulmonary Disease

## 2022-10-22 ENCOUNTER — Encounter (HOSPITAL_BASED_OUTPATIENT_CLINIC_OR_DEPARTMENT_OTHER): Payer: Self-pay | Admitting: Pulmonary Disease

## 2022-10-22 ENCOUNTER — Ambulatory Visit (HOSPITAL_BASED_OUTPATIENT_CLINIC_OR_DEPARTMENT_OTHER): Payer: BC Managed Care – PPO

## 2022-10-22 VITALS — BP 138/74 | HR 90 | Temp 97.6°F | Ht 72.0 in | Wt 187.0 lb

## 2022-10-22 DIAGNOSIS — J189 Pneumonia, unspecified organism: Secondary | ICD-10-CM

## 2022-10-22 DIAGNOSIS — J869 Pyothorax without fistula: Secondary | ICD-10-CM | POA: Diagnosis not present

## 2022-10-22 NOTE — Progress Notes (Signed)
   Subjective:    Patient ID: Wayne Craig, male    DOB: 1976/05/27, 47 y.o.   MRN: [id number]  HPI  47 yo smoker was hospitalized with community-acquired pneumonia, left empyema after RSV infection   Left empyema status post pigtail and intrapleural lytics Cavitary pneumonia versus lung abscess - likely from silent aspiration in setting of poor dentition.    most of the effusion was evacuated on repeat CT 2/1, but he had a hydropneumothorax and entrapped lung.    He took prolonged antibiotics for almost 6 weeks.  Chief Complaint  Patient presents with   Follow-up    Pt states over the past couple of days, he has had discomfort on the left side which he states is where he has had pna in the past.    4/17 augmentin x 1 week CXR 4/15 unchanged scarring left base He reports some discomfort on his left side.  No cough or fevers or sick contacts.  Overall he feels much improved from his hospital stay We repeated chest x-ray CXR 5/10 Persistent lingular atelectasis and blunting of the costophrenic angle.   Significant tests/ events reviewed 1/27 Admit, pigtail catheter inserted 2/1 CT chest >>Moderate LEFT pneumothorax, potentially pneumothorax ex vacuo given persistent necrotic and collapsed lung at the LEFT lung base.Abundant pleural thickening with only minimal pleural fluid remaining in the LEFT chest.  Cystic necrotic areas in the RIGHT lower lobe along the pleural surface, 2/3 dc'd pigtail 3/7 CT chest improved aeration of left lung, resolved left pleural effusion, with very small residual loculated pneumothorax left, small residual cavitary lesion in the left lower lobe, resolved cavitary lesions in right lower lobe, diffuse pleural thickening improved  Review of Systems neg for any significant sore throat, dysphagia, itching, sneezing, nasal congestion or excess/ purulent secretions, fever, chills, sweats, unintended wt loss, pleuritic or exertional cp, hempoptysis, orthopnea  pnd or change in chronic leg swelling. Also denies presyncope, palpitations, heartburn, abdominal pain, nausea, vomiting, diarrhea or change in bowel or urinary habits, dysuria,hematuria, rash, arthralgias, visual complaints, headache, numbness weakness or ataxia.     Objective:   Physical Exam  Gen. Pleasant, well-nourished, in no distress ENT - no thrush, no pallor/icterus,no post nasal drip Neck: No JVD, no thyromegaly, no carotid bruits Lungs: no use of accessory muscles, no dullness to percussion, deceased LT base without rales or rhonchi  Cardiovascular: Rhythm regular, heart sounds  normal, no murmurs or gallops, no peripheral edema Musculoskeletal: No deformities, no cyanosis or clubbing        Assessment & Plan:

## 2022-10-22 NOTE — Patient Instructions (Addendum)
  X CT chest wo con in July 1st week to follow on empyema  Try to set a QUIT date

## 2022-10-22 NOTE — Assessment & Plan Note (Signed)
Appears resolved with thick pleural peel and pleural thickening causing left costophrenic angle blunting on chest x-ray do not feel he needs more antibiotics. Will plan on CT chest without contrast in 2 months

## 2022-12-15 ENCOUNTER — Encounter (HOSPITAL_BASED_OUTPATIENT_CLINIC_OR_DEPARTMENT_OTHER): Payer: Self-pay

## 2022-12-15 ENCOUNTER — Ambulatory Visit (HOSPITAL_BASED_OUTPATIENT_CLINIC_OR_DEPARTMENT_OTHER)
Admission: RE | Admit: 2022-12-15 | Discharge: 2022-12-15 | Disposition: A | Discharge status: Home / Self Care | Payer: BC Managed Care – PPO | Source: Ambulatory Visit | Attending: Pulmonary Disease | Admitting: Pulmonary Disease

## 2022-12-15 DIAGNOSIS — J869 Pyothorax without fistula: Secondary | ICD-10-CM | POA: Insufficient documentation

## 2023-04-04 ENCOUNTER — Encounter (HOSPITAL_COMMUNITY): Payer: Self-pay

## 2023-04-04 ENCOUNTER — Other Ambulatory Visit: Payer: Self-pay

## 2023-04-04 ENCOUNTER — Emergency Department (HOSPITAL_COMMUNITY): Payer: BC Managed Care – PPO

## 2023-04-04 ENCOUNTER — Emergency Department (HOSPITAL_COMMUNITY)
Admission: EM | Admit: 2023-04-04 | Discharge: 2023-04-05 | Discharge status: Against Medical Advice | Payer: BC Managed Care – PPO | Attending: Student | Admitting: Student

## 2023-04-04 DIAGNOSIS — Z5321 Procedure and treatment not carried out due to patient leaving prior to being seen by health care provider: Secondary | ICD-10-CM | POA: Insufficient documentation

## 2023-04-04 DIAGNOSIS — Z20822 Contact with and (suspected) exposure to covid-19: Secondary | ICD-10-CM | POA: Diagnosis not present

## 2023-04-04 DIAGNOSIS — R059 Cough, unspecified: Secondary | ICD-10-CM | POA: Diagnosis present

## 2023-04-04 DIAGNOSIS — R067 Sneezing: Secondary | ICD-10-CM | POA: Diagnosis not present

## 2023-04-04 DIAGNOSIS — R0981 Nasal congestion: Secondary | ICD-10-CM | POA: Diagnosis not present

## 2023-04-04 LAB — RESP PANEL BY RT-PCR (RSV, FLU A&B, COVID)  RVPGX2
Influenza A by PCR: NEGATIVE
Influenza B by PCR: NEGATIVE
Resp Syncytial Virus by PCR: NEGATIVE
SARS Coronavirus 2 by RT PCR: NEGATIVE

## 2023-04-04 NOTE — ED Triage Notes (Signed)
Pt arrived reporting waking up today with sneezing, chest congestion and slight cough. Reports went to urgent care and advised to go to Ed. Patient reports hx of pne, was hospitalized last year. Denies fever, chills, shob or any other symptoms.

## 2023-04-04 NOTE — ED Provider Triage Note (Signed)
Emergency Medicine Provider Triage Evaluation Note  Thijs Kool , a 47 y.o. male  was evaluated in triage.  Pt complains of congestion, cough, sneezing.  Review of Systems  Positive:  Negative:   Physical Exam  BP (!) 183/114 (BP Location: Left Arm)   Pulse (!) 105   Temp 98.2 F (36.8 C) (Oral)   Resp 18   Ht 6' (1.829 m)   Wt 90.3 kg   SpO2 97%   BMI 26.99 kg/m  Gen:   Awake, no distress   Resp:  Normal effort  MSK:   Moves extremities without difficulty  Other:    Medical Decision Making  Medically screening exam initiated at 5:04 PM.  Appropriate orders placed.  Vlad Asai was informed that the remainder of the evaluation will be completed by another provider, this initial triage assessment does not replace that evaluation, and the importance of remaining in the ED until their evaluation is complete.  Congestion, cough, sneezing x1 day. Concerned because he had PNA in February and hospitalized with PTX. Denies fever, SOB, chest pain, nausea, vomiting, diarrhea.   Valrie Hart F, [place] 04/04/23 [phone]

## 2023-04-04 NOTE — ED Notes (Signed)
I called patient to recheck vitals and no one responded
